# Patient Record
Sex: Male | Born: 1985 | Race: Black or African American | Hispanic: No | Marital: Single | Smoking: Current every day smoker
Health system: Southern US, Community
[De-identification: ages and names within clinical notes are randomized; demographics above are authoritative.]

## PROBLEM LIST (undated history)

## (undated) DIAGNOSIS — F1910 Other psychoactive substance abuse, uncomplicated: Secondary | ICD-10-CM

## (undated) HISTORY — PX: ABDOMINAL SURGERY: SHX537

---

## 2015-01-22 ENCOUNTER — Encounter (HOSPITAL_COMMUNITY): Payer: Self-pay | Admitting: Emergency Medicine

## 2015-01-22 ENCOUNTER — Inpatient Hospital Stay (HOSPITAL_COMMUNITY): Payer: Self-pay | Admitting: Anesthesiology

## 2015-01-22 ENCOUNTER — Emergency Department (HOSPITAL_COMMUNITY): Payer: Self-pay

## 2015-01-22 ENCOUNTER — Encounter (HOSPITAL_COMMUNITY): Admission: EM | Disposition: A | Discharge status: Home / Self Care | Payer: Self-pay | Source: Home / Self Care

## 2015-01-22 ENCOUNTER — Inpatient Hospital Stay (HOSPITAL_COMMUNITY): Payer: MEDICAID | Admitting: Anesthesiology

## 2015-01-22 ENCOUNTER — Inpatient Hospital Stay (HOSPITAL_COMMUNITY)
Admission: EM | Admit: 2015-01-22 | Discharge: 2015-01-28 | DRG: 330 | Disposition: A | Discharge status: Home / Self Care | Payer: Self-pay | Attending: General Surgery | Admitting: General Surgery

## 2015-01-22 DIAGNOSIS — F1010 Alcohol abuse, uncomplicated: Secondary | ICD-10-CM | POA: Diagnosis present

## 2015-01-22 DIAGNOSIS — F1210 Cannabis abuse, uncomplicated: Secondary | ICD-10-CM | POA: Diagnosis present

## 2015-01-22 DIAGNOSIS — K266 Chronic or unspecified duodenal ulcer with both hemorrhage and perforation: Principal | ICD-10-CM | POA: Diagnosis present

## 2015-01-22 DIAGNOSIS — F17210 Nicotine dependence, cigarettes, uncomplicated: Secondary | ICD-10-CM | POA: Diagnosis present

## 2015-01-22 DIAGNOSIS — K265 Chronic or unspecified duodenal ulcer with perforation: Secondary | ICD-10-CM | POA: Diagnosis present

## 2015-01-22 DIAGNOSIS — I10 Essential (primary) hypertension: Secondary | ICD-10-CM | POA: Diagnosis present

## 2015-01-22 DIAGNOSIS — R198 Other specified symptoms and signs involving the digestive system and abdomen: Secondary | ICD-10-CM | POA: Diagnosis present

## 2015-01-22 DIAGNOSIS — K567 Ileus, unspecified: Secondary | ICD-10-CM | POA: Diagnosis not present

## 2015-01-22 DIAGNOSIS — F1410 Cocaine abuse, uncomplicated: Secondary | ICD-10-CM | POA: Diagnosis present

## 2015-01-22 DIAGNOSIS — K660 Peritoneal adhesions (postprocedural) (postinfection): Secondary | ICD-10-CM | POA: Diagnosis present

## 2015-01-22 DIAGNOSIS — R109 Unspecified abdominal pain: Secondary | ICD-10-CM

## 2015-01-22 DIAGNOSIS — R52 Pain, unspecified: Secondary | ICD-10-CM

## 2015-01-22 HISTORY — DX: Other psychoactive substance abuse, uncomplicated: F19.10

## 2015-01-22 HISTORY — PX: LAPAROTOMY: SHX154

## 2015-01-22 LAB — URINALYSIS, ROUTINE W REFLEX MICROSCOPIC
BILIRUBIN URINE: NEGATIVE
GLUCOSE, UA: NEGATIVE mg/dL
Ketones, ur: NEGATIVE mg/dL
Leukocytes, UA: NEGATIVE
Nitrite: NEGATIVE
Protein, ur: NEGATIVE mg/dL
SPECIFIC GRAVITY, URINE: 1.014 (ref 1.005–1.030)
UROBILINOGEN UA: 1 mg/dL (ref 0.0–1.0)
pH: 6.5 (ref 5.0–8.0)

## 2015-01-22 LAB — CBC WITH DIFFERENTIAL/PLATELET
BASOS PCT: 0 % (ref 0–1)
Basophils Absolute: 0 10*3/uL (ref 0.0–0.1)
EOS ABS: 0.2 10*3/uL (ref 0.0–0.7)
EOS PCT: 2 % (ref 0–5)
HEMATOCRIT: 52.4 % — AB (ref 39.0–52.0)
HEMOGLOBIN: 17.6 g/dL — AB (ref 13.0–17.0)
Lymphocytes Relative: 24 % (ref 12–46)
Lymphs Abs: 3.1 10*3/uL (ref 0.7–4.0)
MCH: 30.2 pg (ref 26.0–34.0)
MCHC: 33.6 g/dL (ref 30.0–36.0)
MCV: 90 fL (ref 78.0–100.0)
MONO ABS: 0.7 10*3/uL (ref 0.1–1.0)
MONOS PCT: 6 % (ref 3–12)
NEUTROS ABS: 8.6 10*3/uL — AB (ref 1.7–7.7)
Neutrophils Relative %: 68 % (ref 43–77)
Platelets: 280 10*3/uL (ref 150–400)
RBC: 5.82 MIL/uL — ABNORMAL HIGH (ref 4.22–5.81)
RDW: 13.8 % (ref 11.5–15.5)
WBC: 12.6 10*3/uL — ABNORMAL HIGH (ref 4.0–10.5)

## 2015-01-22 LAB — COMPREHENSIVE METABOLIC PANEL
ALBUMIN: 4.2 g/dL (ref 3.5–5.2)
ALT: 25 U/L (ref 0–53)
ANION GAP: 8 (ref 5–15)
AST: 33 U/L (ref 0–37)
Alkaline Phosphatase: 56 U/L (ref 39–117)
BUN: 7 mg/dL (ref 6–23)
CO2: 26 mmol/L (ref 19–32)
CREATININE: 0.98 mg/dL (ref 0.50–1.35)
Calcium: 9.1 mg/dL (ref 8.4–10.5)
Chloride: 106 mmol/L (ref 96–112)
GFR calc Af Amer: >90 mL/min (ref >90)
GFR calc non Af Amer: >90 mL/min (ref >90)
Glucose, Bld: 119 mg/dL — ABNORMAL HIGH (ref 70–99)
Potassium: 3.8 mmol/L (ref 3.5–5.1)
Sodium: 140 mmol/L (ref 135–145)
TOTAL PROTEIN: 7.5 g/dL (ref 6.0–8.3)
Total Bilirubin: 1.4 mg/dL — ABNORMAL HIGH (ref 0.3–1.2)

## 2015-01-22 LAB — CREATININE, SERUM
CREATININE: 1.2 mg/dL (ref 0.50–1.35)
GFR calc Af Amer: >90 mL/min (ref >90)
GFR calc non Af Amer: 80 mL/min — ABNORMAL LOW (ref >90)

## 2015-01-22 LAB — CBC
HCT: 52 % (ref 39.0–52.0)
Hemoglobin: 17.2 g/dL — ABNORMAL HIGH (ref 13.0–17.0)
MCH: 30.4 pg (ref 26.0–34.0)
MCHC: 33.1 g/dL (ref 30.0–36.0)
MCV: 91.9 fL (ref 78.0–100.0)
PLATELETS: 256 10*3/uL (ref 150–400)
RBC: 5.66 MIL/uL (ref 4.22–5.81)
RDW: 13.8 % (ref 11.5–15.5)
WBC: 21.2 10*3/uL — ABNORMAL HIGH (ref 4.0–10.5)

## 2015-01-22 LAB — ETHANOL

## 2015-01-22 LAB — LIPASE, BLOOD: Lipase: 18 U/L (ref 11–59)

## 2015-01-22 LAB — APTT: aPTT: 36 seconds (ref 24–37)

## 2015-01-22 LAB — PROTIME-INR
INR: 1.12 (ref 0.00–1.49)
PROTHROMBIN TIME: 14.5 s (ref 11.6–15.2)

## 2015-01-22 LAB — TYPE AND SCREEN
ABO/RH(D): O POS
ANTIBODY SCREEN: NEGATIVE

## 2015-01-22 LAB — URINE MICROSCOPIC-ADD ON

## 2015-01-22 LAB — ABO/RH: ABO/RH(D): O POS

## 2015-01-22 SURGERY — LAPAROTOMY, EXPLORATORY
Anesthesia: General | Site: Abdomen

## 2015-01-22 MED ORDER — LACTATED RINGERS IV SOLN
INTRAVENOUS | Status: DC
  Administered 2015-01-22: 1000 mL via INTRAVENOUS
  Administered 2015-01-22 (×2): via INTRAVENOUS

## 2015-01-22 MED ORDER — MIDAZOLAM HCL 5 MG/5ML IJ SOLN
INTRAMUSCULAR | Status: DC | PRN
  Administered 2015-01-22: 2 mg via INTRAVENOUS

## 2015-01-22 MED ORDER — HYDROMORPHONE HCL 1 MG/ML IJ SOLN: 0.2500 mg | INTRAMUSCULAR | Status: DC | PRN

## 2015-01-22 MED ORDER — KETAMINE HCL 10 MG/ML IJ SOLN
INTRAMUSCULAR | Status: DC | PRN
  Administered 2015-01-22: 20 mg via INTRAVENOUS
  Administered 2015-01-22: 30 mg via INTRAVENOUS

## 2015-01-22 MED ORDER — ONDANSETRON HCL 4 MG PO TABS: 4.0000 mg | ORAL_TABLET | Freq: Four times a day (QID) | ORAL | Status: DC | PRN

## 2015-01-22 MED ORDER — LIDOCAINE HCL (CARDIAC) 20 MG/ML IV SOLN
INTRAVENOUS | Status: AC
  Filled 2015-01-22: qty 5

## 2015-01-22 MED ORDER — ONDANSETRON HCL 4 MG/2ML IJ SOLN: 4.0000 mg | Freq: Four times a day (QID) | INTRAMUSCULAR | Status: DC | PRN

## 2015-01-22 MED ORDER — DEXAMETHASONE SODIUM PHOSPHATE 10 MG/ML IJ SOLN
INTRAMUSCULAR | Status: AC
  Filled 2015-01-22: qty 1

## 2015-01-22 MED ORDER — ONDANSETRON HCL 4 MG/2ML IJ SOLN: 4.0000 mg | Freq: Once | INTRAMUSCULAR | Status: DC | PRN

## 2015-01-22 MED ORDER — ONDANSETRON HCL 4 MG/2ML IJ SOLN
INTRAMUSCULAR | Status: DC | PRN
  Administered 2015-01-22: 4 mg via INTRAVENOUS

## 2015-01-22 MED ORDER — HYDROMORPHONE HCL 1 MG/ML IJ SOLN
0.2500 mg | INTRAMUSCULAR | Status: DC | PRN
  Administered 2015-01-22: 0.25 mg via INTRAVENOUS
  Administered 2015-01-22 (×2): 0.5 mg via INTRAVENOUS
  Administered 2015-01-22: 0.25 mg via INTRAVENOUS

## 2015-01-22 MED ORDER — HYDROCODONE-ACETAMINOPHEN 5-325 MG PO TABS: 1.0000 | ORAL_TABLET | ORAL | Status: DC | PRN

## 2015-01-22 MED ORDER — POTASSIUM CHLORIDE IN NACL 20-0.9 MEQ/L-% IV SOLN
INTRAVENOUS | Status: DC
  Filled 2015-01-22 (×2): qty 1000

## 2015-01-22 MED ORDER — SODIUM CHLORIDE 0.9 % IR SOLN
Status: DC | PRN
  Administered 2015-01-22: 4000 mL

## 2015-01-22 MED ORDER — PROPOFOL 10 MG/ML IV BOLUS
INTRAVENOUS | Status: AC
  Filled 2015-01-22: qty 20

## 2015-01-22 MED ORDER — HYDROMORPHONE HCL 2 MG/ML IJ SOLN
INTRAMUSCULAR | Status: AC
  Filled 2015-01-22: qty 1

## 2015-01-22 MED ORDER — HYDROMORPHONE HCL 1 MG/ML IJ SOLN
INTRAMUSCULAR | Status: DC | PRN
  Administered 2015-01-22: 0.5 mg via INTRAVENOUS

## 2015-01-22 MED ORDER — PANTOPRAZOLE SODIUM 40 MG IV SOLR
40.0000 mg | Freq: Two times a day (BID) | INTRAVENOUS | Status: DC
  Administered 2015-01-22 – 2015-01-27 (×11): 40 mg via INTRAVENOUS
  Filled 2015-01-22 (×13): qty 40

## 2015-01-22 MED ORDER — ONDANSETRON HCL 4 MG/2ML IJ SOLN
4.0000 mg | Freq: Once | INTRAMUSCULAR | Status: AC
Start: 2015-01-22 — End: 2015-01-22
  Administered 2015-01-22: 4 mg via INTRAVENOUS
  Filled 2015-01-22: qty 2

## 2015-01-22 MED ORDER — SODIUM CHLORIDE 0.9 % IJ SOLN
INTRAMUSCULAR | Status: AC
  Filled 2015-01-22: qty 10

## 2015-01-22 MED ORDER — PIPERACILLIN-TAZOBACTAM 3.375 G IVPB
3.3750 g | Freq: Three times a day (TID) | INTRAVENOUS | Status: DC
  Administered 2015-01-22 – 2015-01-28 (×17): 3.375 g via INTRAVENOUS
  Filled 2015-01-22 (×20): qty 50

## 2015-01-22 MED ORDER — PANTOPRAZOLE SODIUM 40 MG IV SOLR: 40.0000 mg | Freq: Two times a day (BID) | INTRAVENOUS | Status: DC

## 2015-01-22 MED ORDER — CISATRACURIUM BESYLATE 20 MG/10ML IV SOLN
INTRAVENOUS | Status: AC
  Filled 2015-01-22: qty 10

## 2015-01-22 MED ORDER — MIDAZOLAM HCL 2 MG/2ML IJ SOLN
INTRAMUSCULAR | Status: AC
  Filled 2015-01-22: qty 2

## 2015-01-22 MED ORDER — PIPERACILLIN-TAZOBACTAM 3.375 G IVPB: 3.3750 g | Freq: Three times a day (TID) | INTRAVENOUS | Status: DC

## 2015-01-22 MED ORDER — ENOXAPARIN SODIUM 40 MG/0.4ML ~~LOC~~ SOLN
40.0000 mg | SUBCUTANEOUS | Status: DC
  Administered 2015-01-23 – 2015-01-28 (×6): 40 mg via SUBCUTANEOUS
  Filled 2015-01-22 (×6): qty 0.4

## 2015-01-22 MED ORDER — HYDROMORPHONE HCL 1 MG/ML IJ SOLN
INTRAMUSCULAR | Status: AC
  Filled 2015-01-22: qty 1

## 2015-01-22 MED ORDER — PIPERACILLIN-TAZOBACTAM 3.375 G IVPB 30 MIN
3.3750 g | INTRAVENOUS | Status: AC
  Administered 2015-01-22: 3.375 g via INTRAVENOUS
  Filled 2015-01-22: qty 50

## 2015-01-22 MED ORDER — SODIUM CHLORIDE 0.9 % IV BOLUS (SEPSIS)
1000.0000 mL | Freq: Once | INTRAVENOUS | Status: AC
  Administered 2015-01-22: 1000 mL via INTRAVENOUS

## 2015-01-22 MED ORDER — SUFENTANIL CITRATE 50 MCG/ML IV SOLN
INTRAVENOUS | Status: DC | PRN
  Administered 2015-01-22: 5 ug via INTRAVENOUS
  Administered 2015-01-22: 10 ug via INTRAVENOUS
  Administered 2015-01-22: 5 ug via INTRAVENOUS
  Administered 2015-01-22: 10 ug via INTRAVENOUS
  Administered 2015-01-22: 20 ug via INTRAVENOUS

## 2015-01-22 MED ORDER — MORPHINE SULFATE 2 MG/ML IJ SOLN: 1.0000 mg | INTRAMUSCULAR | Status: DC | PRN

## 2015-01-22 MED ORDER — HYDROMORPHONE HCL 1 MG/ML IJ SOLN
0.5000 mg | Freq: Once | INTRAMUSCULAR | Status: AC
  Administered 2015-01-22: 0.5 mg via INTRAVENOUS
  Filled 2015-01-22: qty 1

## 2015-01-22 MED ORDER — PANTOPRAZOLE SODIUM 40 MG IV SOLR
40.0000 mg | Freq: Once | INTRAVENOUS | Status: AC
Start: 2015-01-22 — End: 2015-01-22
  Administered 2015-01-22: 40 mg via INTRAVENOUS
  Filled 2015-01-22: qty 40

## 2015-01-22 MED ORDER — HYDROMORPHONE HCL 1 MG/ML IJ SOLN
1.0000 mg | INTRAMUSCULAR | Status: DC | PRN
  Administered 2015-01-22 – 2015-01-23 (×8): 1 mg via INTRAVENOUS
  Filled 2015-01-22 (×8): qty 1

## 2015-01-22 MED ORDER — DEXAMETHASONE SODIUM PHOSPHATE 10 MG/ML IJ SOLN
INTRAMUSCULAR | Status: DC | PRN
  Administered 2015-01-22: 10 mg via INTRAVENOUS

## 2015-01-22 MED ORDER — LIDOCAINE HCL (CARDIAC) 20 MG/ML IV SOLN
INTRAVENOUS | Status: DC | PRN
  Administered 2015-01-22: 100 mg via INTRAVENOUS

## 2015-01-22 MED ORDER — ONDANSETRON HCL 4 MG/2ML IJ SOLN
INTRAMUSCULAR | Status: AC
  Filled 2015-01-22: qty 2

## 2015-01-22 MED ORDER — HYDROMORPHONE HCL 1 MG/ML IJ SOLN
0.5000 mg | Freq: Once | INTRAMUSCULAR | Status: AC
Start: 2015-01-22 — End: 2015-01-22
  Administered 2015-01-22: 0.5 mg via INTRAVENOUS
  Filled 2015-01-22: qty 1

## 2015-01-22 MED ORDER — NEOSTIGMINE METHYLSULFATE 10 MG/10ML IV SOLN
INTRAVENOUS | Status: AC
  Filled 2015-01-22: qty 1

## 2015-01-22 MED ORDER — NEOSTIGMINE METHYLSULFATE 10 MG/10ML IV SOLN
INTRAVENOUS | Status: DC | PRN
  Administered 2015-01-22: 4 mg via INTRAVENOUS

## 2015-01-22 MED ORDER — PROPOFOL 10 MG/ML IV BOLUS
INTRAVENOUS | Status: DC | PRN
  Administered 2015-01-22: 200 mg via INTRAVENOUS

## 2015-01-22 MED ORDER — SUFENTANIL CITRATE 50 MCG/ML IV SOLN
INTRAVENOUS | Status: AC
  Filled 2015-01-22: qty 1

## 2015-01-22 MED ORDER — HYDROMORPHONE HCL 1 MG/ML IJ SOLN: 1.0000 mg | INTRAMUSCULAR | Status: DC | PRN

## 2015-01-22 MED ORDER — CISATRACURIUM BESYLATE (PF) 10 MG/5ML IV SOLN
INTRAVENOUS | Status: DC | PRN
  Administered 2015-01-22: 10 mg via INTRAVENOUS

## 2015-01-22 MED ORDER — KCL IN DEXTROSE-NACL 20-5-0.45 MEQ/L-%-% IV SOLN
INTRAVENOUS | Status: DC
  Administered 2015-01-22 – 2015-01-24 (×5): via INTRAVENOUS
  Administered 2015-01-25: 125 mL/h via INTRAVENOUS
  Administered 2015-01-25: 1000 mL via INTRAVENOUS
  Administered 2015-01-26 (×3): via INTRAVENOUS
  Administered 2015-01-27: 50 mL/h via INTRAVENOUS
  Filled 2015-01-22 (×16): qty 1000

## 2015-01-22 MED ORDER — GLYCOPYRROLATE 0.2 MG/ML IJ SOLN
INTRAMUSCULAR | Status: DC | PRN
  Administered 2015-01-22: 0.6 mg via INTRAVENOUS

## 2015-01-22 MED ORDER — SUCCINYLCHOLINE CHLORIDE 20 MG/ML IJ SOLN
INTRAMUSCULAR | Status: DC | PRN
Start: 2015-01-22 — End: 2015-01-22
  Administered 2015-01-22: 100 mg via INTRAVENOUS

## 2015-01-22 SURGICAL SUPPLY — 42 items
APPLICATOR COTTON TIP 6IN STRL (MISCELLANEOUS) IMPLANT
BLADE EXTENDED COATED 6.5IN (ELECTRODE) ×2 IMPLANT
BLADE HEX COATED 2.75 (ELECTRODE) ×2 IMPLANT
COVER MAYO STAND STRL (DRAPES) ×2 IMPLANT
DRAIN CHANNEL 19F RND (DRAIN) ×2 IMPLANT
DRAPE LAPAROSCOPIC ABDOMINAL (DRAPES) ×2 IMPLANT
DRAPE WARM FLUID 44X44 (DRAPE) ×2 IMPLANT
ELECT REM PT RETURN 9FT ADLT (ELECTROSURGICAL) ×2
ELECTRODE REM PT RTRN 9FT ADLT (ELECTROSURGICAL) ×1 IMPLANT
EVACUATOR DRAINAGE 10X20 100CC (DRAIN) ×1 IMPLANT
EVACUATOR SILICONE 100CC (DRAIN) ×1
GAUZE SPONGE 4X4 12PLY STRL (GAUZE/BANDAGES/DRESSINGS) ×2 IMPLANT
GLOVE BIOGEL PI IND STRL 7.0 (GLOVE) ×1 IMPLANT
GLOVE BIOGEL PI INDICATOR 7.0 (GLOVE) ×1
GLOVE SURG ORTHO 8.0 STRL STRW (GLOVE) ×2 IMPLANT
GOWN STRL REUS W/TWL LRG LVL3 (GOWN DISPOSABLE) ×2 IMPLANT
GOWN STRL REUS W/TWL XL LVL3 (GOWN DISPOSABLE) ×4 IMPLANT
HOLDER FOLEY CATH W/STRAP (MISCELLANEOUS) ×2 IMPLANT
KIT BASIN OR (CUSTOM PROCEDURE TRAY) ×2 IMPLANT
NS IRRIG 1000ML POUR BTL (IV SOLUTION) ×2 IMPLANT
PACK GENERAL/GYN (CUSTOM PROCEDURE TRAY) ×2 IMPLANT
PAD ABD 8X10 STRL (GAUZE/BANDAGES/DRESSINGS) ×2 IMPLANT
SPONGE LAP 18X18 X RAY DECT (DISPOSABLE) IMPLANT
STAPLER VISISTAT 35W (STAPLE) ×2 IMPLANT
SUCTION POOLE TIP (SUCTIONS) ×2 IMPLANT
SUT ETHILON 3 0 PS 1 (SUTURE) ×2 IMPLANT
SUT NOV 1 T60/GS (SUTURE) IMPLANT
SUT NOVA NAB DX-16 0-1 5-0 T12 (SUTURE) ×6 IMPLANT
SUT SILK 2 0 (SUTURE) ×1
SUT SILK 2 0 SH CR/8 (SUTURE) ×2 IMPLANT
SUT SILK 2-0 18XBRD TIE 12 (SUTURE) ×1 IMPLANT
SUT SILK 3 0 (SUTURE) ×1
SUT SILK 3 0 SH CR/8 (SUTURE) ×2 IMPLANT
SUT SILK 3-0 18XBRD TIE 12 (SUTURE) ×1 IMPLANT
SUT VICRYL 2 0 18  UND BR (SUTURE)
SUT VICRYL 2 0 18 UND BR (SUTURE) IMPLANT
TAPE CLOTH SURG 6X10 WHT LF (GAUZE/BANDAGES/DRESSINGS) ×2 IMPLANT
TOWEL OR 17X26 10 PK STRL BLUE (TOWEL DISPOSABLE) ×4 IMPLANT
TRAY FOLEY CATH 14FRSI W/METER (CATHETERS) IMPLANT
TRAY FOLEY CATH 16FR SILVER (SET/KITS/TRAYS/PACK) ×2 IMPLANT
WATER STERILE IRR 1500ML POUR (IV SOLUTION) IMPLANT
YANKAUER SUCT BULB TIP NO VENT (SUCTIONS) ×2 IMPLANT

## 2015-01-22 NOTE — H&P (Signed)
Victor Monroe is an 29 y.o. male.   Chief Complaint:  "excruiating abdominal pain" HPI: 29 y/o male who reports nausea and vomiting on and off for 2 months.  He has blood in his emesis, so he has quit drinking.  Unfortunately he continues to have abdominal pain, with nausea and vomiting.  He had it last PM when he went to bed, with hematemesis.  This morning he woke up and tried to get up to go to bathroom.   He reports,  "excruciating pain," and was transferred to the hospital. He has not had anything this bad before this AM.  Pain is mid epigastric.   Work up in the ED shows he is afebrile, but hypertensive.  Labs show elevated bilirubin 1.4.  WBC 12.6.  H/H 17.6/52.  Film in the ED shows:There is free air under right hemidiaphragm consistent with perforated viscus. Mild gaseous distended small bowel loops mid abdomen. Ileus or bowel obstruction cannot be excluded.  We were called for the Free air.  Past Medical History  Diagnosis Date  . Polysubstance abuse including ETOH, tobacco, cocaine and marijuana      History reviewed. No pertinent past surgical history: no surgeries  History reviewed. No pertinent family history. Social History:  reports that he has been smoking.  He does not have any smokeless tobacco history on file. He reports that he drinks alcohol. He reports that he uses illicit drugs (Cocaine). Tobacco:  ,1PPD for 14 years ETOH:  2-3 bottles of wine per day for 2-3 years, quit about 2 months ago with onset of hematemesis  Drugs:  Cocaine biweekly, Marijuana weekly Single lives with family Works maintance at [employer]   Allergies: No Known Allergies  Prior to Admission medications   Not on File      Results for orders placed or performed during the hospital encounter of 01/22/15 (from the past 48 hour(s))  CBC with Differential/Platelet     Status: Abnormal   Collection Time: 01/22/15  8:32 AM  Result Value Ref Range   WBC 12.6 (H) 4.0 - 10.5 K/uL   RBC 5.82  (H) 4.22 - 5.81 MIL/uL   Hemoglobin 17.6 (H) 13.0 - 17.0 g/dL   HCT 52.4 (H) 39.0 - 52.0 %   MCV 90.0 78.0 - 100.0 fL   MCH 30.2 26.0 - 34.0 pg   MCHC 33.6 30.0 - 36.0 g/dL   RDW 13.8 11.5 - 15.5 %   Platelets 280 150 - 400 K/uL   Neutrophils Relative % 68 43 - 77 %   Neutro Abs 8.6 (H) 1.7 - 7.7 K/uL   Lymphocytes Relative 24 12 - 46 %   Lymphs Abs 3.1 0.7 - 4.0 K/uL   Monocytes Relative 6 3 - 12 %   Monocytes Absolute 0.7 0.1 - 1.0 K/uL   Eosinophils Relative 2 0 - 5 %   Eosinophils Absolute 0.2 0.0 - 0.7 K/uL   Basophils Relative 0 0 - 1 %   Basophils Absolute 0.0 0.0 - 0.1 K/uL  Lipase, blood     Status: None   Collection Time: 01/22/15  8:32 AM  Result Value Ref Range   Lipase 18 11 - 59 U/L  Comprehensive metabolic panel     Status: Abnormal   Collection Time: 01/22/15  8:32 AM  Result Value Ref Range   Sodium 140 135 - 145 mmol/L   Potassium 3.8 3.5 - 5.1 mmol/L   Chloride 106 96 - 112 mmol/L   CO2 26 19 -  32 mmol/L   Glucose, Bld 119 (H) 70 - 99 mg/dL   BUN 7 6 - 23 mg/dL   Creatinine, Ser 0.98 0.50 - 1.35 mg/dL   Calcium 9.1 8.4 - 10.5 mg/dL   Total Protein 7.5 6.0 - 8.3 g/dL   Albumin 4.2 3.5 - 5.2 g/dL   AST 33 0 - 37 U/L   ALT 25 0 - 53 U/L   Alkaline Phosphatase 56 39 - 117 U/L   Total Bilirubin 1.4 (H) 0.3 - 1.2 mg/dL   GFR calc non Af Amer >90 >90 mL/min   GFR calc Af Amer >90 >90 mL/min    Comment: (NOTE) The eGFR has been calculated using the CKD EPI equation. This calculation has not been validated in all clinical situations. eGFR's persistently <90 mL/min signify possible Chronic Kidney Disease.    Anion gap 8 5 - 15   Dg Abd Acute W/chest  01/22/2015   CLINICAL DATA:  Abdominal pain, polysubstance abuse  EXAM: DG ABDOMEN ACUTE W/ 1V CHEST  COMPARISON:  None.  FINDINGS: Cardiomediastinal silhouette is unremarkable. No acute infiltrate or pleural effusion. No pulmonary edema. There is free air under right hemidiaphragm consistent with perforated  viscus. Mild gaseous distended small bowel loops mid abdomen. Ileus or bowel obstruction cannot be excluded.  IMPRESSION: No acute infiltrate or pleural effusion. No pulmonary edema. There is free air under right hemidiaphragm consistent with perforated viscus. Mild gaseous distended small bowel loops mid abdomen. Ileus or bowel obstruction cannot be excluded.  These results were called by telephone at the time of interpretation on 01/22/2015 at 9:16 am to Dr. Milton Ferguson , who verbally acknowledged these results.   Electronically Signed   By: Lahoma Crocker M.D.   On: 01/22/2015 09:17    Review of Systems  Constitutional: Positive for weight loss (he is not sure how much). Negative for fever, chills, malaise/fatigue and diaphoresis.  HENT: Negative.   Eyes: Negative.   Respiratory: Negative.   Cardiovascular: Negative.   Gastrointestinal: Positive for nausea, vomiting and abdominal pain. Negative for diarrhea, constipation, blood in stool and melena.  Genitourinary: Negative.   Musculoskeletal: Negative.   Skin: Negative.   Neurological: Negative.  Negative for weakness.  Endo/Heme/Allergies: Negative.   Psychiatric/Behavioral: Negative.     Blood pressure 161/107, pulse 65, temperature 98.1 F (36.7 C), temperature source Oral, resp. rate 20, SpO2 100 %. Physical Exam  Constitutional: He is oriented to person, place, and time.  29 y/o male no acute distress, uncomfortable.  overweight  HENT:  Head: Normocephalic and atraumatic.  Nose: Nose normal.  Eyes: Conjunctivae and EOM are normal. Right eye exhibits no discharge. Left eye exhibits no discharge. No scleral icterus.  Neck: Normal range of motion. Neck supple. No JVD present. No tracheal deviation present. No thyromegaly present.  Cardiovascular: Normal rate, regular rhythm, normal heart sounds and intact distal pulses.   No murmur heard. Respiratory: Effort normal and breath sounds normal. No respiratory distress. He has no wheezes.  He has no rales. He exhibits no tenderness.  GI: Soft. He exhibits no distension and no mass. There is tenderness. There is no rebound and no guarding.  Musculoskeletal: He exhibits no edema.  Lymphadenopathy:    He has no cervical adenopathy.  Neurological: He is alert and oriented to person, place, and time. A cranial nerve deficit is present.  Skin: Skin is warm and dry. No rash noted. No erythema. No pallor.  Psychiatric: He has a normal mood and affect. His  behavior is normal. Judgment and thought content normal.     Assessment/Plan Perforated viscus  Polysubstance use.  Plan:  For the OR, exploratory laparotomy with possible bowel resection.  JENNINGS,WILLARD 01/22/2015, 9:34 AM

## 2015-01-22 NOTE — ED Notes (Signed)
Per EMS: Pt c/o gen abd pain x 2 months.  States he cut back on drinking when the pain started and he noticed that he would have blood tinged sputum.  States he "put it off".  Admits to cocaine and etoh use.  Last etoh was 3 days ago.  States he was not able to urinate this morning so he called 911.

## 2015-01-22 NOTE — ED Notes (Signed)
Pt returned from X-ray.  

## 2015-01-22 NOTE — Anesthesia Procedure Notes (Signed)
Procedure Name: Intubation Date/Time: 01/22/2015 12:39 PM Performed by: Leroy LibmanEARDON, Victor Fana L Patient Re-evaluated:Patient Re-evaluated prior to inductionOxygen Delivery Method: Circle system utilized Preoxygenation: Pre-oxygenation with 100% oxygen Intubation Type: IV induction, Rapid sequence and Cricoid Pressure applied Laryngoscope Size: Miller and 3 Grade View: Grade I Tube type: Oral Tube size: 8.0 mm Number of attempts: 1 Airway Equipment and Method: Stylet Placement Confirmation: ETT inserted through vocal cords under direct vision,  breath sounds checked- equal and bilateral and positive ETCO2 Secured at: 22 cm Tube secured with: Tape Dental Injury: Teeth and Oropharynx as per pre-operative assessment

## 2015-01-22 NOTE — Brief Op Note (Signed)
01/22/2015  1:39 PM  PATIENT:  Victor Monroe  29 y.o. male  PRE-OPERATIVE DIAGNOSIS:  perforated viscus  POST-OPERATIVE DIAGNOSIS:  perforated duodenal ulcer  PROCEDURE:  Procedure(s): EXPLORATORY LAPAROTOMY,  GRAHAM PATCH CLOSURE OF PERFORATED DUODENAL ULCER (N/A)  SURGEON:  Surgeon(s) and Role:    * Darnell Levelodd Sayana Salley, MD - Primary  ANESTHESIA:   general  EBL:  Total I/O In: 2000 [I.V.:2000] Out: 150 [Urine:100; Blood:50]  BLOOD ADMINISTERED:none  DRAINS: (19Fr) Blake drain(s) in the subhepatic space   LOCAL MEDICATIONS USED:  NONE  SPECIMEN:  No Specimen  DISPOSITION OF SPECIMEN:  N/A  COUNTS:  YES  TOURNIQUET:  * No tourniquets in log *  DICTATION: .Other Dictation: Dictation Number [id number]) [id number]  PLAN OF CARE: Admit to inpatient   PATIENT DISPOSITION:  PACU - hemodynamically stable.   Delay start of Pharmacological VTE agent (>24hrs) due to surgical blood loss or risk of bleeding: yes  Velora Hecklerodd M. Leonell Lobdell, MD, [facility] Office: [phone]

## 2015-01-22 NOTE — ED Notes (Signed)
Bed: WA04 Expected date:  Expected time:  Means of arrival:  Comments: abd pain 

## 2015-01-22 NOTE — ED Notes (Signed)
Pt currently in X-ray.

## 2015-01-22 NOTE — ED Provider Notes (Signed)
CSN: [id number]     Arrival date & time 01/22/15  0801 History   First MD Initiated Contact with Patient 01/22/15 [phone]     Chief Complaint  Patient presents with  . Abdominal Pain     (Consider location/radiation/quality/duration/timing/severity/associated sxs/prior Treatment) Patient is a 29 y.o. male presenting with abdominal pain. The history is provided by the patient (the pt complains of abd pain for 2 months.  much worse today).  Abdominal Pain Pain location:  Epigastric Pain quality: not aching   Pain radiates to:  Does not radiate Pain severity:  Moderate Onset quality:  Sudden Timing:  Constant Progression:  Unchanged Chronicity:  New Context: not alcohol use   Relieved by:  Nothing Associated symptoms: no chest pain, no cough, no diarrhea, no fatigue and no hematuria     Past Medical History  Diagnosis Date  . Polysubstance abuse    History reviewed. No pertinent past surgical history. History reviewed. No pertinent family history. History  Substance Use Topics  . Smoking status: Current Every Day Smoker  . Smokeless tobacco: Not on file  . Alcohol Use: Yes    Review of Systems  Constitutional: Negative for appetite change and fatigue.  HENT: Negative for congestion, ear discharge and sinus pressure.   Eyes: Negative for discharge.  Respiratory: Negative for cough.   Cardiovascular: Negative for chest pain.  Gastrointestinal: Positive for abdominal pain. Negative for diarrhea.  Genitourinary: Negative for frequency and hematuria.  Musculoskeletal: Negative for back pain.  Skin: Negative for rash.  Neurological: Negative for seizures and headaches.  Psychiatric/Behavioral: Negative for hallucinations.      Allergies  Review of patient's allergies indicates no known allergies.  Home Medications   Prior to Admission medications   Not on File   BP 161/107 mmHg  Pulse 65  Temp(Src) 98.1 F (36.7 C) (Oral)  Resp 20  SpO2 100% Physical Exam   Constitutional: He is oriented to person, place, and time. He appears well-developed.  HENT:  Head: Normocephalic.  Eyes: Conjunctivae and EOM are normal. No scleral icterus.  Neck: Neck supple. No thyromegaly present.  Cardiovascular: Normal rate and regular rhythm.  Exam reveals no gallop and no friction rub.   No murmur heard. Pulmonary/Chest: No stridor. He has no wheezes. He has no rales. He exhibits no tenderness.  Abdominal: He exhibits no distension. There is tenderness. There is no rebound.  Musculoskeletal: Normal range of motion. He exhibits no edema.  Lymphadenopathy:    He has no cervical adenopathy.  Neurological: He is oriented to person, place, and time. He exhibits normal muscle tone. Coordination normal.  Skin: No rash noted. No erythema.  Psychiatric: He has a normal mood and affect. His behavior is normal.    ED Course  Procedures (including critical care time) Labs Review Labs Reviewed  CBC WITH DIFFERENTIAL/PLATELET - Abnormal; Notable for the following:    WBC 12.6 (*)    RBC 5.82 (*)    Hemoglobin 17.6 (*)    HCT 52.4 (*)    Neutro Abs 8.6 (*)    All other components within normal limits  COMPREHENSIVE METABOLIC PANEL - Abnormal; Notable for the following:    Glucose, Bld 119 (*)    Total Bilirubin 1.4 (*)    All other components within normal limits  LIPASE, BLOOD  ETHANOL  APTT  PROTIME-INR  DRUGS OF ABUSE SCREEN W/O ALC, ROUTINE URINE  URINALYSIS, ROUTINE W REFLEX MICROSCOPIC  TYPE AND SCREEN    Imaging Review Dg Abd  Acute W/chest  01/22/2015   CLINICAL DATA:  Abdominal pain, polysubstance abuse  EXAM: DG ABDOMEN ACUTE W/ 1V CHEST  COMPARISON:  None.  FINDINGS: Cardiomediastinal silhouette is unremarkable. No acute infiltrate or pleural effusion. No pulmonary edema. There is free air under right hemidiaphragm consistent with perforated viscus. Mild gaseous distended small bowel loops mid abdomen. Ileus or bowel obstruction cannot be excluded.   IMPRESSION: No acute infiltrate or pleural effusion. No pulmonary edema. There is free air under right hemidiaphragm consistent with perforated viscus. Mild gaseous distended small bowel loops mid abdomen. Ileus or bowel obstruction cannot be excluded.  These results were called by telephone at the time of interpretation on 01/22/2015 at 9:16 am to Dr. Bethann BerkshireJOSEPH Quentyn Kolbeck , who verbally acknowledged these results.   Electronically Signed   By: Natasha MeadLiviu  Pop M.D.   On: 01/22/2015 09:17     EKG Interpretation   Date/Time:  Wednesday January 22 2015 08:08:42 EDT Ventricular Rate:  59 PR Interval:  147 QRS Duration: 101 QT Interval:  422 QTC Calculation: 418 R Axis:   77 Text Interpretation:  Sinus rhythm No old tracing to compare Confirmed by  CAMPOS  MD, Caryn BeeKEVIN ([id number]) on 01/22/2015 8:11:10 AM      MDM   Final diagnoses:  Pain  Abdominal pain in male    Admit to surgery for ruptured viscous     Bethann BerkshireJoseph Zan Orlick, MD 01/22/15 1031

## 2015-01-22 NOTE — Transfer of Care (Signed)
Immediate Anesthesia Transfer of Care Note  Patient: Victor Monroe  Procedure(s) Performed: Procedure(s): EXPLORATORY LAPAROTOMY,  GRAHAM PATCH CLOSURE OF PERFORATED DUODENAL ULCER (N/A)  Patient Location: PACU  Anesthesia Type:General  Level of Consciousness: awake  Airway & Oxygen Therapy: Patient Spontanous Breathing and Patient connected to face mask oxygen  Post-op Assessment: Report given to RN and Post -op Vital signs reviewed and stable  Post vital signs: Reviewed and stable  Last Vitals:  Filed Vitals:   01/22/15 0803  BP: 161/107  Pulse: 65  Temp: 36.7 C  Resp: 20    Complications: No apparent anesthesia complications

## 2015-01-22 NOTE — Progress Notes (Signed)
ANTIBIOTIC CONSULT NOTE - INITIAL  Pharmacy Consult for Zosyn Indication: Intra-abdominal infection  No Known Allergies  Patient Measurements:   Adjusted Body Weight:   Vital Signs: Temp: 98.1 F (36.7 C) (04/13 0803) Temp Source: Oral (04/13 0803) BP: 161/107 mmHg (04/13 0803) Pulse Rate: 65 (04/13 0803) Intake/Output from previous day:   Intake/Output from this shift:    Labs:  Recent Labs  01/22/15 0832  WBC 12.6*  HGB 17.6*  PLT 280  CREATININE 0.98   CrCl cannot be calculated (Unknown ideal weight.). No results for input(s): VANCOTROUGH, VANCOPEAK, VANCORANDOM, GENTTROUGH, GENTPEAK, GENTRANDOM, TOBRATROUGH, TOBRAPEAK, TOBRARND, AMIKACINPEAK, AMIKACINTROU, AMIKACIN in the last 72 hours.   Microbiology: No results found for this or any previous visit (from the past 720 hour(s)).  Medical History: Past Medical History  Diagnosis Date  . Polysubstance abuse    Assessment: [age] yoM with PMHx polysubstance abuse presents with "excrutiating abdominal pain" and hematemesis as well as with nausea and vomiting intermittently for 2 months.  DG of abd shows free air with perforated viscus.  Plan for expolatory laparotomy with possible bowel resection.  Pharmacy consulted to start Zosyn.   4/13 >> Zosyn  >>    No micro data yet.   Goal of Therapy:  Eradication of infection  Plan:  Zosyn 3.375g over 30 min NOW then start Zosyn 3.375g IV q8h (infuse over 4 hours) F/u renal function, clinical course  Haynes Hoehnolleen Levorn Oleski, PharmD, BCPS 01/22/2015, 10:53 AM  Pager: [phone]

## 2015-01-22 NOTE — Anesthesia Preprocedure Evaluation (Signed)
Anesthesia Evaluation  Patient identified by MRN, date of birth, ID band Patient awake    Reviewed: Allergy & Precautions, NPO status , Patient's Chart, lab work & pertinent test results  Airway Mallampati: II  TM Distance: >3 FB Neck ROM: Full    Dental no notable dental hx.    Pulmonary Current Smoker,  breath sounds clear to auscultation  Pulmonary exam normal       Cardiovascular negative cardio ROS  Rhythm:Regular Rate:Normal     Neuro/Psych negative neurological ROS  negative psych ROS   GI/Hepatic negative GI ROS, Neg liver ROS,   Endo/Other  negative endocrine ROS  Renal/GU negative Renal ROS  negative genitourinary   Musculoskeletal negative musculoskeletal ROS (+)   Abdominal   Peds negative pediatric ROS (+)  Hematology negative hematology ROS (+)   Anesthesia Other Findings   Reproductive/Obstetrics negative OB ROS                             Anesthesia Physical Anesthesia Plan  ASA: III  Anesthesia Plan: General   Post-op Pain Management:    Induction: Intravenous  Airway Management Planned: Oral ETT  Additional Equipment:   Intra-op Plan:   Post-operative Plan: Extubation in OR  Informed Consent: I have reviewed the patients History and Physical, chart, labs and discussed the procedure including the risks, benefits and alternatives for the proposed anesthesia with the patient or authorized representative who has indicated his/her understanding and acceptance.   Dental advisory given  Plan Discussed with: CRNA  Anesthesia Plan Comments:         Anesthesia Quick Evaluation

## 2015-01-22 NOTE — ED Notes (Signed)
Pt CHG Prepped and changed into new gown

## 2015-01-22 NOTE — Anesthesia Postprocedure Evaluation (Signed)
  Anesthesia Post-op Note  Patient: Victor Monroe  Procedure(s) Performed: Procedure(s): EXPLORATORY LAPAROTOMY,  GRAHAM PATCH CLOSURE OF PERFORATED DUODENAL ULCER (N/A)  Patient Location: PACU  Anesthesia Type:General  Level of Consciousness: awake, oriented, sedated and patient cooperative  Airway and Oxygen Therapy: Patient Spontanous Breathing  Post-op Pain: mild  Post-op Assessment: Post-op Vital signs reviewed, Patient's Cardiovascular Status Stable, Respiratory Function Stable, Patent Airway, No signs of Nausea or vomiting and Pain level controlled  Post-op Vital Signs: stable  Last Vitals:  Filed Vitals:   01/22/15 1507  BP: 142/78  Pulse: 55  Temp: 36.9 C  Resp:     Complications: No apparent anesthesia complications

## 2015-01-23 ENCOUNTER — Encounter (HOSPITAL_COMMUNITY): Payer: Self-pay | Admitting: Surgery

## 2015-01-23 LAB — DRUGS OF ABUSE SCREEN W/O ALC, ROUTINE URINE
Amphetamine Screen, Ur: NEGATIVE
BENZODIAZEPINES.: POSITIVE — AB
Barbiturate Quant, Ur: NEGATIVE
COCAINE METABOLITES: POSITIVE — AB
Creatinine,U: 144.5 mg/dL
Marijuana Metabolite: POSITIVE — AB
Methadone: NEGATIVE
OPIATE SCREEN, URINE: NEGATIVE
PROPOXYPHENE: NEGATIVE
Phencyclidine (PCP): NEGATIVE

## 2015-01-23 LAB — BASIC METABOLIC PANEL
Anion gap: 6 (ref 5–15)
BUN: 8 mg/dL (ref 6–23)
CHLORIDE: 107 mmol/L (ref 96–112)
CO2: 26 mmol/L (ref 19–32)
Calcium: 8.8 mg/dL (ref 8.4–10.5)
Creatinine, Ser: 1.05 mg/dL (ref 0.50–1.35)
GFR calc non Af Amer: >90 mL/min (ref >90)
Glucose, Bld: 123 mg/dL — ABNORMAL HIGH (ref 70–99)
POTASSIUM: 4.3 mmol/L (ref 3.5–5.1)
SODIUM: 139 mmol/L (ref 135–145)

## 2015-01-23 LAB — CBC
HCT: 46.2 % (ref 39.0–52.0)
Hemoglobin: 15 g/dL (ref 13.0–17.0)
MCH: 29.7 pg (ref 26.0–34.0)
MCHC: 32.5 g/dL (ref 30.0–36.0)
MCV: 91.5 fL (ref 78.0–100.0)
PLATELETS: 220 10*3/uL (ref 150–400)
RBC: 5.05 MIL/uL (ref 4.22–5.81)
RDW: 13.9 % (ref 11.5–15.5)
WBC: 20.7 10*3/uL — AB (ref 4.0–10.5)

## 2015-01-23 MED ORDER — PHENOL 1.4 % MT LIQD
1.0000 | OROMUCOSAL | Status: DC | PRN
  Administered 2015-01-23: 1 via OROMUCOSAL
  Filled 2015-01-23: qty 177

## 2015-01-23 NOTE — Op Note (Signed)
[name], Victor Monroe          ACCOUNT NO.:  [id number]  MEDICAL RECORD NO.:  [id number]  LOCATION:  1540                         FACILITY[facility]  [facility]  PHYSICIAN:  Velora Heckler, MD      DATE OF BIRTH:  [birth year]  DATE OF PROCEDURE:  01/22/2015                              OPERATIVE REPORT   PREOPERATIVE DIAGNOSIS:  Perforated viscus.  POSTOPERATIVE DIAGNOSIS:  Perforated duodenal ulcer.  PROCEDURE: 1. Exploratory laparotomy. 2. Cheree Ditto patch closure of perforated duodenal ulcer.  SURGEON:  Velora Heckler, MD, FACS  ANESTHESIA:  General.  ESTIMATED BLOOD LOSS:  Minimal.  PREPARATION:  ChloraPrep.  COMPLICATIONS:  None.  INDICATIONS:  The patient is a 29 year old male who presented to the emergency department with abdominal pain.  Laboratory studies showed a mildly elevated white blood cell count.  Abdominal x-ray showed free air in the upper abdomen.  The patient was prepared urgently and brought to the operating room for exploration.  BODY OF REPORT:  Procedure was done in OR #1 at the [facility].  The patient was brought to the operating room, placed in supine position on the operating room table.  Following administration of general anesthesia, the patient was positioned and then prepped and draped in the usual aseptic fashion.  After ascertaining that an adequate level of anesthesia had been achieved, an upper midline abdominal incision was made with a #10 blade.  Dissection was carried through subcutaneous tissues.  Fascia was incised in the midline and the peritoneal cavity was entered cautiously.  There was greenish thin fluid throughout the upper abdomen.  This was evacuated. Balfour retractors placed for exposure.  Exploration reveals a 6 mm punctate ulcer immediately post pyloric in the duodenal bulb located anteriorly.  Omentum was freed from adhesions.  A tongue of omentum was prepared.  An attempt was made to primarily close the ulcer  with a 2-0 silk suture, but the tissue was quite friable and the suture material pulls through the walls of the ulcer easily.  Therefore, a g patch closure was performed using interrupted 2-0 silk sutures and a tongue of omentum which was secured over the ulcer with the 2-0 silk sutures which were then tied securely.  Good overlap and closure of the ulcer was achieved.  Abdomen was irrigated with several L of warm saline which were evacuated.  Greenish fluid was present all the way to the pelvis, the left upper quadrant, and above the liver.  This was all evacuated and irrigated until clear.  A 19-French Blake drain was brought in from right upper quadrant abdominal wall stab wound and placed across the proximal duodenum.  Drain was secured to the skin with a 3-0 nylon suture.  Midline abdominal incision was closed with interrupted #1 Novafil simple sutures.  Subcutaneous tissues were irrigated.  Skin was closed with stainless steel staples.  Drain was placed to bulb suction.  Sterile dressings were applied to the wound.  The patient was awakened from anesthesia and brought to the recovery room.  The patient tolerated the procedure well.   Velora Heckler, MD, [facility] Office: [phone]    TMG/MEDQ  D:  01/22/2015  T:  01/23/2015  Job:  [id number]

## 2015-01-23 NOTE — Progress Notes (Signed)
1 Day Post-Op  Subjective: He is doing well this AM.  Wounds look fine drain is clear.    Objective: Vital signs in last 24 hours: Temp:  [97.7 F (36.5 C)-98.7 F (37.1 C)] 97.9 F (36.6 C) (04/14 0600) Pulse Rate:  [53-75] 57 (04/14 0600) Resp:  [15-21] 18 (04/14 0600) BP: (121-165)/(70-109) 130/74 mmHg (04/14 0600) SpO2:  [96 %-100 %] 100 % (04/14 0600) Weight:  [104.327 kg (230 lb)] 104.327 kg (230 lb) (04/13 1215) Last BM Date: 01/21/15 1070 from NG NPO Afebrile, VSS WBC 20.7  Intake/Output from previous day: 04/13 0701 - 04/14 0700 In: 3720 [I.V.:3700; NG/GT:20] Out: 2810 [Urine:1575; Emesis/NG output:1070; Drains:115; Blood:50] Intake/Output this shift:    General appearance: alert, cooperative and no distress Resp: clear to auscultation bilaterally GI: soft, no bowel sounds, incision looks fine, drain is clear.  Lab Results:   Recent Labs  01/22/15 1720 01/23/15 0430  WBC 21.2* 20.7*  HGB 17.2* 15.0  HCT 52.0 46.2  PLT 256 220    BMET  Recent Labs  01/22/15 0832 01/22/15 1720 01/23/15 0430  NA 140  --  139  K 3.8  --  4.3  CL 106  --  107  CO2 26  --  26  GLUCOSE 119*  --  123*  BUN 7  --  8  CREATININE 0.98 1.20 1.05  CALCIUM 9.1  --  8.8   PT/INR  Recent Labs  01/22/15 0948  LABPROT 14.5  INR 1.12     Recent Labs Lab 01/22/15 0832  AST 33  ALT 25  ALKPHOS 56  BILITOT 1.4*  PROT 7.5  ALBUMIN 4.2     Lipase     Component Value Date/Time   LIPASE 18 01/22/2015 16100832     Studies/Results: Dg Abd Acute W/chest  01/22/2015   CLINICAL DATA:  Abdominal pain, polysubstance abuse  EXAM: DG ABDOMEN ACUTE W/ 1V CHEST  COMPARISON:  None.  FINDINGS: Cardiomediastinal silhouette is unremarkable. No acute infiltrate or pleural effusion. No pulmonary edema. There is free air under right hemidiaphragm consistent with perforated viscus. Mild gaseous distended small bowel loops mid abdomen. Ileus or bowel obstruction cannot be excluded.   IMPRESSION: No acute infiltrate or pleural effusion. No pulmonary edema. There is free air under right hemidiaphragm consistent with perforated viscus. Mild gaseous distended small bowel loops mid abdomen. Ileus or bowel obstruction cannot be excluded.  These results were called by telephone at the time of interpretation on 01/22/2015 at 9:16 am to Dr. Bethann BerkshireJOSEPH ZAMMIT , who verbally acknowledged these results.   Electronically Signed   By: Natasha MeadLiviu  Pop M.D.   On: 01/22/2015 09:17    Medications: . enoxaparin (LOVENOX) injection  40 mg Subcutaneous Q24H  . pantoprazole (PROTONIX) IV  40 mg Intravenous Q12H  . piperacillin-tazobactam  3.375 g Intravenous 3 times per day    Assessment/Plan 1.  Perforated duodenal ulcer/peritonitis EXPLORATORY LAPAROTOMY, GRAHAM PATCH CLOSURE OF PERFORATED DUODENAL ULCER, 01/22/15, Dr. Darnell Levelodd Gerkin 2.  Polysubstance use. 3.  Body mass index is 33.9 4.  DVT:  SCD/Lovenox 5.  Day 2 Zosyn   Plan:  Foley out, mobilize some, continue NG drainage, antibiotics, IS and hydration.  He denies any prior issues with withdrawal from substance.  I offered nicotine replacement ,but he thinks he will be fine without it. CBC in AM.   LOS: 1 day    Victor Monroe 01/23/2015

## 2015-01-24 LAB — CBC
HCT: 42.4 % (ref 39.0–52.0)
HEMOGLOBIN: 13.4 g/dL (ref 13.0–17.0)
MCH: 29.2 pg (ref 26.0–34.0)
MCHC: 31.6 g/dL (ref 30.0–36.0)
MCV: 92.4 fL (ref 78.0–100.0)
PLATELETS: 242 10*3/uL (ref 150–400)
RBC: 4.59 MIL/uL (ref 4.22–5.81)
RDW: 14 % (ref 11.5–15.5)
WBC: 13.8 10*3/uL — AB (ref 4.0–10.5)

## 2015-01-24 NOTE — Progress Notes (Signed)
[facility] Progress Note  2 Days Post-Op  Subjective: Pt doing well, up ambulating to the bathroom and in the halls.  Thirsty, NG bothering him.  No flatus or BM yet.  No N/V, abdominal pain well controlled.  Objective: Vital signs in last 24 hours: Temp:  [97.8 F (36.6 C)-98.8 F (37.1 C)] 98.6 F (37 C) (04/15 0618) Pulse Rate:  [51-61] 61 (04/15 0618) Resp:  [18] 18 (04/15 0618) BP: (107-137)/(69-82) 127/80 mmHg (04/15 0618) SpO2:  [100 %] 100 % (04/15 0618) Last BM Date: 01/21/15  Intake/Output from previous day: 04/14 0701 - 04/15 0700 In: 3200 [I.V.:3000; IV Piggyback:200] Out: 2500 [Urine:800; Emesis/NG output:1600; Drains:100] Intake/Output this shift:    PE: Gen:  Alert, NAD, pleasant Abd: Soft, tender over midline incision and over JP drain, diminished BS, no HSM, incisions C/D/I with staples in place, RLQ drain with minimal serosanguinous drainage   Lab Results:   Recent Labs  01/23/15 0430 01/24/15 0418  WBC 20.7* 13.8*  HGB 15.0 13.4  HCT 46.2 42.4  PLT 220 242   BMET  Recent Labs  01/22/15 0832 01/22/15 1720 01/23/15 0430  NA 140  --  139  K 3.8  --  4.3  CL 106  --  107  CO2 26  --  26  GLUCOSE 119*  --  123*  BUN 7  --  8  CREATININE 0.98 1.20 1.05  CALCIUM 9.1  --  8.8   PT/INR  Recent Labs  01/22/15 0948  LABPROT 14.5  INR 1.12   CMP     Component Value Date/Time   NA 139 01/23/2015 0430   K 4.3 01/23/2015 0430   CL 107 01/23/2015 0430   CO2 26 01/23/2015 0430   GLUCOSE 123* 01/23/2015 0430   BUN 8 01/23/2015 0430   CREATININE 1.05 01/23/2015 0430   CALCIUM 8.8 01/23/2015 0430   PROT 7.5 01/22/2015 0832   ALBUMIN 4.2 01/22/2015 0832   AST 33 01/22/2015 0832   ALT 25 01/22/2015 0832   ALKPHOS 56 01/22/2015 0832   BILITOT 1.4* 01/22/2015 0832   GFRNONAA >90 01/23/2015 0430   GFRAA >90 01/23/2015 0430   Lipase     Component Value Date/Time   LIPASE 18 01/22/2015 0832       Studies/Results: Dg  Abd Acute W/chest  01/22/2015   CLINICAL DATA:  Abdominal pain, polysubstance abuse  EXAM: DG ABDOMEN ACUTE W/ 1V CHEST  COMPARISON:  None.  FINDINGS: Cardiomediastinal silhouette is unremarkable. No acute infiltrate or pleural effusion. No pulmonary edema. There is free air under right hemidiaphragm consistent with perforated viscus. Mild gaseous distended small bowel loops mid abdomen. Ileus or bowel obstruction cannot be excluded.  IMPRESSION: No acute infiltrate or pleural effusion. No pulmonary edema. There is free air under right hemidiaphragm consistent with perforated viscus. Mild gaseous distended small bowel loops mid abdomen. Ileus or bowel obstruction cannot be excluded.  These results were called by telephone at the time of interpretation on 01/22/2015 at 9:16 am to Dr. Bethann BerkshireJOSEPH ZAMMIT , who verbally acknowledged these results.   Electronically Signed   By: Natasha MeadLiviu  Pop M.D.   On: 01/22/2015 09:17    Anti-infectives: Anti-infectives    Start     Dose/Rate Route Frequency Ordered Stop   01/22/15 2000  [MAR Hold]  piperacillin-tazobactam (ZOSYN) IVPB 3.375 g  Status:  Discontinued     (MAR Hold since 01/22/15 1218)   3.375 g 12.5 mL/hr over 240 Minutes Intravenous Every 8 hours  01/22/15 1047 01/22/15 1345   01/22/15 2000  piperacillin-tazobactam (ZOSYN) IVPB 3.375 g     3.375 g 12.5 mL/hr over 240 Minutes Intravenous 3 times per day 01/22/15 1345     01/22/15 1400  piperacillin-tazobactam (ZOSYN) IVPB 3.375 g  Status:  Discontinued     3.375 g 12.5 mL/hr over 240 Minutes Intravenous 3 times per day 01/22/15 1036 01/22/15 1045   01/22/15 1100  piperacillin-tazobactam (ZOSYN) IVPB 3.375 g     3.375 g 100 mL/hr over 30 Minutes Intravenous NOW 01/22/15 1046 01/22/15 1212       Assessment/Plan 1. Perforated duodenal ulcer/peritonitis POD #2 s/p EXPLORATORY LAPAROTOMY,GRAHAM PATCH CLOSURE OF PERFORATED DUODENAL ULCER, 01/22/15, Dr. Darnell Level -Foley out yesterday -Ambulate and IS -NG  tube (164mL/24hr), NPO, IVF, pain control, antiemetics -WBC improved to 13.8 today -UGI series to check for leak tomorrow -Labs in AM -Day #3 IV Zosyn 2. Polysubstance use 3. Body mass index is 33.9 4. DVT: SCD/Lovenox    LOS: 2 days    Victor Monroe, Victor Monroe 01/24/2015, 7:30 AM Pager: [phone]

## 2015-01-25 ENCOUNTER — Inpatient Hospital Stay (HOSPITAL_COMMUNITY): Payer: Self-pay

## 2015-01-25 LAB — BASIC METABOLIC PANEL
Anion gap: 7 (ref 5–15)
BUN: 5 mg/dL — ABNORMAL LOW (ref 6–23)
CALCIUM: 9.2 mg/dL (ref 8.4–10.5)
CHLORIDE: 105 mmol/L (ref 96–112)
CO2: 28 mmol/L (ref 19–32)
Creatinine, Ser: 1.25 mg/dL (ref 0.50–1.35)
GFR calc non Af Amer: 76 mL/min — ABNORMAL LOW (ref >90)
GFR, EST AFRICAN AMERICAN: 88 mL/min — AB (ref >90)
GLUCOSE: 103 mg/dL — AB (ref 70–99)
POTASSIUM: 3.6 mmol/L (ref 3.5–5.1)
SODIUM: 140 mmol/L (ref 135–145)

## 2015-01-25 LAB — CBC
HCT: 45.8 % (ref 39.0–52.0)
Hemoglobin: 14.8 g/dL (ref 13.0–17.0)
MCH: 29.8 pg (ref 26.0–34.0)
MCHC: 32.3 g/dL (ref 30.0–36.0)
MCV: 92.2 fL (ref 78.0–100.0)
Platelets: 256 10*3/uL (ref 150–400)
RBC: 4.97 MIL/uL (ref 4.22–5.81)
RDW: 13.6 % (ref 11.5–15.5)
WBC: 11.9 10*3/uL — ABNORMAL HIGH (ref 4.0–10.5)

## 2015-01-25 MED ORDER — IOHEXOL 300 MG/ML  SOLN
50.0000 mL | Freq: Once | INTRAMUSCULAR | Status: AC | PRN
  Administered 2015-01-25: 80 mL via ORAL

## 2015-01-25 NOTE — Progress Notes (Signed)
3 Days Post-Op  Subjective: Not having much pain.  Passing gas.  Objective: Vital signs in last 24 hours: Temp:  [98.2 F (36.8 C)-98.6 F (37 C)] 98.6 F (37 C) (04/16 0557) Pulse Rate:  [57-78] 57 (04/16 0557) Resp:  [18] 18 (04/16 0557) BP: (110-152)/(62-89) 147/83 mmHg (04/16 0557) SpO2:  [97 %-100 %] 100 % (04/16 0557) Last BM Date: 01/21/15  Intake/Output from previous day: 04/15 0701 - 04/16 0700 In: 3200 [I.V.:3000; IV Piggyback:200] Out: 3485 [Urine:2500; Emesis/NG output:950; Drains:35] Intake/Output this shift:    PE: General- In NAD Abdomen-soft, flat, incision clean and intact, serous drain output  Lab Results:   Recent Labs  01/24/15 0418 01/25/15 0536  WBC 13.8* 11.9*  HGB 13.4 14.8  HCT 42.4 45.8  PLT 242 256   BMET  Recent Labs  01/23/15 0430 01/25/15 0536  NA 139 140  K 4.3 3.6  CL 107 105  CO2 26 28  GLUCOSE 123* 103*  BUN 8 5*  CREATININE 1.05 1.25  CALCIUM 8.8 9.2   PT/INR  Recent Labs  01/22/15 0948  LABPROT 14.5  INR 1.12   Comprehensive Metabolic Panel:    Component Value Date/Time   NA 140 01/25/2015 0536   NA 139 01/23/2015 0430   K 3.6 01/25/2015 0536   K 4.3 01/23/2015 0430   CL 105 01/25/2015 0536   CL 107 01/23/2015 0430   CO2 28 01/25/2015 0536   CO2 26 01/23/2015 0430   BUN 5* 01/25/2015 0536   BUN 8 01/23/2015 0430   CREATININE 1.25 01/25/2015 0536   CREATININE 1.05 01/23/2015 0430   GLUCOSE 103* 01/25/2015 0536   GLUCOSE 123* 01/23/2015 0430   CALCIUM 9.2 01/25/2015 0536   CALCIUM 8.8 01/23/2015 0430   AST 33 01/22/2015 0832   ALT 25 01/22/2015 0832   ALKPHOS 56 01/22/2015 0832   BILITOT 1.4* 01/22/2015 0832   PROT 7.5 01/22/2015 0832   ALBUMIN 4.2 01/22/2015 0832     Studies/Results: No results found.  Anti-infectives: Anti-infectives    Start     Dose/Rate Route Frequency Ordered Stop   01/22/15 2000  [MAR Hold]  piperacillin-tazobactam (ZOSYN) IVPB 3.375 g  Status:  Discontinued      (MAR Hold since 01/22/15 1218)   3.375 g 12.5 mL/hr over 240 Minutes Intravenous Every 8 hours 01/22/15 1047 01/22/15 1345   01/22/15 2000  piperacillin-tazobactam (ZOSYN) IVPB 3.375 g     3.375 g 12.5 mL/hr over 240 Minutes Intravenous 3 times per day 01/22/15 1345     01/22/15 1400  piperacillin-tazobactam (ZOSYN) IVPB 3.375 g  Status:  Discontinued     3.375 g 12.5 mL/hr over 240 Minutes Intravenous 3 times per day 01/22/15 1036 01/22/15 1045   01/22/15 1100  piperacillin-tazobactam (ZOSYN) IVPB 3.375 g     3.375 g 100 mL/hr over 30 Minutes Intravenous NOW 01/22/15 1046 01/22/15 1212      Assessment Perforated duodenal ulcer/peritonitis POD #2 s/p EXPLORATORY LAPAROTOMY,GRAHAM PATCH CLOSURE OF PERFORATED DUODENAL ULCER, 01/22/15, Dr. Donney Rankinsodd Gerkin-progressing well; on IV abxs and Protonix; H. Pylori pending  LOS: 3 days   Plan: UGI today.   Victor Monroe 01/25/2015

## 2015-01-25 NOTE — Progress Notes (Addendum)
ANTIBIOTIC CONSULT NOTE - Follow Up  Pharmacy Consult for Zosyn Indication: Intra-abdominal infection  No Known Allergies  Patient Measurements: Height: 5\' 9"  (175.3 cm) Weight: 230 lb (104.327 kg) IBW/kg (Calculated) : 70.7 Adjusted Body Weight:   Vital Signs: Temp: 98.6 F (37 C) (04/16 0557) Temp Source: Oral (04/16 0557) BP: 147/83 mmHg (04/16 0557) Pulse Rate: 57 (04/16 0557) Intake/Output from previous day: 04/15 0701 - 04/16 0700 In: 3200 [I.V.:3000; IV Piggyback:200] Out: 3485 [Urine:2500; Emesis/NG output:950; Drains:35] Intake/Output from this shift:    Labs:  Recent Labs  01/22/15 1720 01/23/15 0430 01/24/15 0418 01/25/15 0536  WBC 21.2* 20.7* 13.8* 11.9*  HGB 17.2* 15.0 13.4 14.8  PLT 256 220 242 256  CREATININE 1.20 1.05  --  1.25   Estimated Creatinine Clearance: 102.8 mL/min (by C-G formula based on Cr of 1.25). No results for input(s): VANCOTROUGH, VANCOPEAK, VANCORANDOM, GENTTROUGH, GENTPEAK, GENTRANDOM, TOBRATROUGH, TOBRAPEAK, TOBRARND, AMIKACINPEAK, AMIKACINTROU, AMIKACIN in the last 72 hours.   Microbiology: No results found for this or any previous visit (from the past 720 hour(s)).  Medical History: Past Medical History  Diagnosis Date  . Polysubstance abuse    Assessment: 89 yoM with PMHx polysubstance abuse presents with "excrutiating abdominal pain" and hematemesis as well as with nausea and vomiting intermittently for 2 months.  DG of abd shows free air with perforated viscus.  S/p ex lap and perforated DU closure 01/22/15.  Pharmacy consulted to dose Zosyn.   4/13 >> Zosyn >>  Tmax: AF WBC: mildly elevated Renal: SCr 1.25, CrCl>100 ml/min  No cultures obtained.  Goal of Therapy:  Doses adjusted per renal function Eradication of infection  Plan:  Continue Zosyn 3.375g IV q8h (4 hour infusion time).  Pharmacy will continue to follow but will sign off from leaving progress notes.  Clance BollAmanda Wynonia Medero, PharmD, BCPS Pager:  [phone] 01/25/2015 2:33 PM

## 2015-01-26 NOTE — Progress Notes (Signed)
4 Days Post-Op  Subjective: Bowels moving.    Objective: Vital signs in last 24 hours: Temp:  [97.9 F (36.6 C)-98.5 F (36.9 C)] 98.5 F (36.9 C) (04/17 0517) Pulse Rate:  [69-84] 84 (04/17 0517) Resp:  [18] 18 (04/17 0517) BP: (138-143)/(74-82) 143/82 mmHg (04/17 0517) SpO2:  [99 %-100 %] 100 % (04/17 0517) Last BM Date: 01/21/15  Intake/Output from previous day: 04/16 0701 - 04/17 0700 In: 3150 [I.V.:3000; IV Piggyback:150] Out: 2590 [Urine:1850; Emesis/NG output:700; Drains:40] Intake/Output this shift:    PE: General- In NAD Abdomen-soft, flat, incision clean and intact, serous drain output  Lab Results:   Recent Labs  01/24/15 0418 01/25/15 0536  WBC 13.8* 11.9*  HGB 13.4 14.8  HCT 42.4 45.8  PLT 242 256   BMET  Recent Labs  01/25/15 0536  NA 140  K 3.6  CL 105  CO2 28  GLUCOSE 103*  BUN 5*  CREATININE 1.25  CALCIUM 9.2   PT/INR No results for input(s): LABPROT, INR in the last 72 hours. Comprehensive Metabolic Panel:    Component Value Date/Time   NA 140 01/25/2015 0536   NA 139 01/23/2015 0430   K 3.6 01/25/2015 0536   K 4.3 01/23/2015 0430   CL 105 01/25/2015 0536   CL 107 01/23/2015 0430   CO2 28 01/25/2015 0536   CO2 26 01/23/2015 0430   BUN 5* 01/25/2015 0536   BUN 8 01/23/2015 0430   CREATININE 1.25 01/25/2015 0536   CREATININE 1.05 01/23/2015 0430   GLUCOSE 103* 01/25/2015 0536   GLUCOSE 123* 01/23/2015 0430   CALCIUM 9.2 01/25/2015 0536   CALCIUM 8.8 01/23/2015 0430   AST 33 01/22/2015 0832   ALT 25 01/22/2015 0832   ALKPHOS 56 01/22/2015 0832   BILITOT 1.4* 01/22/2015 0832   PROT 7.5 01/22/2015 0832   ALBUMIN 4.2 01/22/2015 0832     Studies/Results: Dg Ugi W/water Sol Cm  01/25/2015   CLINICAL DATA:  29 year old male with perforated duodenum ulcer status post graham patch repair. Evaluate for leak.  EXAM: UPPER GI SERIES WITH KUB  TECHNIQUE: After obtaining a scout radiograph a routine upper GI series was performed  using water soluble contrast  FLUOROSCOPY TIME:  If the device does not provide the exposure index:  Fluoroscopy Time (in minutes and seconds):  1 minutes 34 seconds  Number of Acquired Images:  8  COMPARISON:  KUB 01/22/2015  FINDINGS: Initial KUB images demonstrate a nasogastric tube position within the stomach. A surgical drain projects over the left upper quadrant in the region of the proximal duodenum.  Following administration of water soluble contrast material through the nasogastric tube contrast material passes freely into the duodenum and in distally into the proximal small bowel. There is no evidence of contrast extravasation. Multiple spot films were taken in several obliquities to confirm. There is some to and fro motion in the proximal duodenum.  IMPRESSION: No evidence of leak status post graham patch repair of perforated duodenal ulcer.   Electronically Signed   By: Malachy MoanHeath  McCullough M.D.   On: 01/25/2015 12:32    Anti-infectives: Anti-infectives    Start     Dose/Rate Route Frequency Ordered Stop   01/22/15 2000  [MAR Hold]  piperacillin-tazobactam (ZOSYN) IVPB 3.375 g  Status:  Discontinued     (MAR Hold since 01/22/15 1218)   3.375 g 12.5 mL/hr over 240 Minutes Intravenous Every 8 hours 01/22/15 1047 01/22/15 1345   01/22/15 2000  piperacillin-tazobactam (ZOSYN) IVPB 3.375 g  3.375 g 12.5 mL/hr over 240 Minutes Intravenous 3 times per day 01/22/15 1345     01/22/15 1400  piperacillin-tazobactam (ZOSYN) IVPB 3.375 g  Status:  Discontinued     3.375 g 12.5 mL/hr over 240 Minutes Intravenous 3 times per day 01/22/15 1036 01/22/15 1045   01/22/15 1100  piperacillin-tazobactam (ZOSYN) IVPB 3.375 g     3.375 g 100 mL/hr over 30 Minutes Intravenous NOW 01/22/15 1046 01/22/15 1212      Assessment Perforated duodenal ulcer/peritonitis POD #2 s/p EXPLORATORY LAPAROTOMY,GRAHAM PATCH CLOSURE OF PERFORATED DUODENAL ULCER, 01/22/15, Dr. Charolette Forward looks good with no leak; bowel  function returning;  H. Pylori pending                                                                                                                   LOS: 4 days   Plan: Remove ng tube.  Clear liquids.  Continue abxs and Protonix.   Stefano Trulson J 01/26/2015

## 2015-01-27 LAB — CBC
HEMATOCRIT: 47 % (ref 39.0–52.0)
Hemoglobin: 15.5 g/dL (ref 13.0–17.0)
MCH: 29.8 pg (ref 26.0–34.0)
MCHC: 33 g/dL (ref 30.0–36.0)
MCV: 90.2 fL (ref 78.0–100.0)
Platelets: 303 10*3/uL (ref 150–400)
RBC: 5.21 MIL/uL (ref 4.22–5.81)
RDW: 13.2 % (ref 11.5–15.5)
WBC: 10.2 10*3/uL (ref 4.0–10.5)

## 2015-01-27 LAB — COMPREHENSIVE METABOLIC PANEL
ALK PHOS: 42 U/L (ref 39–117)
ALT: 14 U/L (ref 0–53)
AST: 20 U/L (ref 0–37)
Albumin: 3.9 g/dL (ref 3.5–5.2)
Anion gap: 7 (ref 5–15)
BUN: 5 mg/dL — ABNORMAL LOW (ref 6–23)
CO2: 27 mmol/L (ref 19–32)
Calcium: 8.9 mg/dL (ref 8.4–10.5)
Chloride: 102 mmol/L (ref 96–112)
Creatinine, Ser: 1.21 mg/dL (ref 0.50–1.35)
GFR calc Af Amer: >90 mL/min (ref >90)
GFR, EST NON AFRICAN AMERICAN: 79 mL/min — AB (ref >90)
Glucose, Bld: 90 mg/dL (ref 70–99)
POTASSIUM: 4.3 mmol/L (ref 3.5–5.1)
SODIUM: 136 mmol/L (ref 135–145)
Total Bilirubin: 1.8 mg/dL — ABNORMAL HIGH (ref 0.3–1.2)
Total Protein: 7.1 g/dL (ref 6.0–8.3)

## 2015-01-27 NOTE — Discharge Summary (Signed)
Physician Discharge Summary  Patient ID: Victor Monroe MRN: [id number] DOB/AGE: [birth year] 29 y.o.  Admit date: 01/22/2015 Discharge date: 01/28/2015  Admission Diagnoses:  1. Perforated duodenal ulcer/peritonitis 2. Polysubstance use. 3. Body mass index is 33.9  Discharge Diagnoses:   Principal Problem:   Perforated duodenal ulcer Active Problems:   Perforated viscus   PROCEDURES: EXPLORATORY LAPAROTOMY, GRAHAM PATCH CLOSURE OF PERFORATED DUODENAL ULCER, 01/22/15, Dr. Alean Rinneodd Gerkin  Hospital Course:  29 y/o male who reports nausea and vomiting on and off for 2 months. He has blood in his emesis, so he has quit drinking. Unfortunately he continues to have abdominal pain, with nausea and vomiting. He had it last PM when he went to bed, with hematemesis. This morning he woke up and tried to get up to go to bathroom. He reports, "excruciating pain," and was transferred to the hospital. He has not had anything this bad before this AM. Pain is mid epigastric.  Work up in the ED shows he is afebrile, but hypertensive. Labs show elevated bilirubin 1.4. WBC 12.6. H/H 17.6/52. Drug screen: positive for benzodiazepines, marijuana, and cocaine.    Film in the ED shows:There is free air under right hemidiaphragm consistent with perforated viscus. Mild gaseous distended small bowel loops mid abdomen. Ileus or bowel obstruction cannot be excluded.  We were called for the Free air. Pt was admitted and started on IV antibiotics, hydration and taken to the OR for exploration.  Findings include a perforated duodenal ulcer.  This was patched.  He did well thru surgery.  Post op he had an ileus, his diet is being slowly advanced, as bowel function returns.  UGI on 01/25/15 showed no leak of ulcer near perforation site.  He continues to do well we hope to send him home later on today, 01/28/15. He is up to a soft diet Work up here included a positive drug screen, H pylori is still pending  and will need to be addressed as an Outpatient.  He was maintained on IV Zosyn for 6 days, and sent home on 3 more days of Augmentin.    CBC Latest Ref Rng 01/27/2015 01/25/2015 01/24/2015  WBC 4.0 - 10.5 K/uL 10.2 11.9(H) 13.8(H)  Hemoglobin 13.0 - 17.0 g/dL 30.815.5 65.714.8 84.613.4  Hematocrit 39.0 - 52.0 % 47.0 45.8 42.4  Platelets 150 - 400 K/uL 303 256 242   CMP Latest Ref Rng 01/27/2015 01/25/2015 01/23/2015  Glucose 70 - 99 mg/dL 90 962(X103(H) 528(U123(H)  BUN 6 - 23 mg/dL 5(L) 5(L) 8  Creatinine 0.50 - 1.35 mg/dL 1.321.21 4.401.25 1.021.05  Sodium 135 - 145 mmol/L 136 140 139  Potassium 3.5 - 5.1 mmol/L 4.3 3.6 4.3  Chloride 96 - 112 mmol/L 102 105 107  CO2 19 - 32 mmol/L 27 28 26   Calcium 8.4 - 10.5 mg/dL 8.9 9.2 8.8  Total Protein 6.0 - 8.3 g/dL 7.1 - -  Total Bilirubin 0.3 - 1.2 mg/dL 7.2(Z1.8(H) - -  Alkaline Phos 39 - 117 U/L 42 - -  AST 0 - 37 U/L 20 - -  ALT 0 - 53 U/L 14 - -    H Pylori:  Pending  Condition on d/c:  Improved    Disposition: Final discharge disposition not confirmed     Medication List    TAKE these medications        acetaminophen 325 MG tablet  Commonly known as:  TYLENOL  Take 2 tablets (650 mg total) by mouth every 6 (six) hours as needed.  amoxicillin-clavulanate 875-125 MG per tablet  Commonly known as:  AUGMENTIN  Take 1 tablet by mouth every 12 (twelve) hours.     HYDROcodone-acetaminophen 5-325 MG per tablet  Commonly known as:  NORCO/VICODIN  Take 1-2 tablets by mouth every 4 (four) hours as needed for moderate pain.     pantoprazole 40 MG tablet  Commonly known as:  PROTONIX  Take 1 tablet (40 mg total) by mouth daily.       Follow-up Information    Follow up with Velora Heckler, MD On 01/31/2015.   Specialty:  General Surgery   Why:  You need to be at our office for check in at 2:15 for staple removal.   Contact information:   [address] [place] [address] [phone]       Schedule an appointment as soon as possible for a visit  with Velora Heckler, MD.   Specialty:  General Surgery   Why:  CAll and ask for an appointment in 2 weeks.     Contact information:   [address] [place] [address] [phone]       Follow up with Primary care.   Why:  Obtain a Primary Care doctor to follow up on your medical issues.      SignedSherrie George 01/28/2015, 8:18 AM

## 2015-01-27 NOTE — Progress Notes (Signed)
CSW consulted to assist with [facility] resources. Pt not available to meet with CSW, at this time ( bathroom ? ). CSW will return .  Cori RazorJamie Durk Carmen LCSW [phone]

## 2015-01-27 NOTE — Progress Notes (Signed)
5 Days Post-Op  Subjective: He is doing well with clears and has had 2 BM's.  He feels much better.  Objective: Vital signs in last 24 hours: Temp:  [98.3 F (36.8 C)-98.9 F (37.2 C)] 98.4 F (36.9 C) (04/18 0530) Pulse Rate:  [50-84] 53 (04/18 0530) Resp:  [18] 18 (04/18 0530) BP: (126-138)/(69-99) 133/88 mmHg (04/18 0530) SpO2:  [98 %-100 %] 98 % (04/18 0530) Last BM Date: 01/26/15 Diet:  Clears  120 PO recorded 2400 urine 130 from drain recorded 1 BM Afebtrile, VSS Labs OK bilirubin still up some 1.8 WBC OK,  H Pylori is still not back  Intake/Output from previous day: 04/17 0701 - 04/18 0700 In: 2299.2 [P.O.:120; I.V.:2029.2; IV Piggyback:150] Out: 2530 [Urine:2400; Drains:130] Intake/Output this shift: Total I/O In: -  Out: 650 [Urine:650]  General appearance: alert, cooperative and no distress Resp: clear to auscultation bilaterally GI: soft, still sore, but no pain, wound OK, + BS, drain is clear.  Lab Results:   Recent Labs  01/25/15 0536 01/27/15 0755  WBC 11.9* 10.2  HGB 14.8 15.5  HCT 45.8 47.0  PLT 256 303    BMET  Recent Labs  01/25/15 0536 01/27/15 0755  NA 140 136  K 3.6 4.3  CL 105 102  CO2 28 27  GLUCOSE 103* 90  BUN 5* 5*  CREATININE 1.25 1.21  CALCIUM 9.2 8.9   PT/INR No results for input(s): LABPROT, INR in the last 72 hours.   Recent Labs Lab 01/22/15 0832 01/27/15 0755  AST 33 20  ALT 25 14  ALKPHOS 56 42  BILITOT 1.4* 1.8*  PROT 7.5 7.1  ALBUMIN 4.2 3.9     Lipase     Component Value Date/Time   LIPASE 18 01/22/2015 1610     Studies/Results: Dg Ugi W/water Sol Cm  01/25/2015   CLINICAL DATA:  29 year old male with perforated duodenum ulcer status post graham patch repair. Evaluate for leak.  EXAM: UPPER GI SERIES WITH KUB  TECHNIQUE: After obtaining a scout radiograph a routine upper GI series was performed using water soluble contrast  FLUOROSCOPY TIME:  If the device does not provide the exposure index:   Fluoroscopy Time (in minutes and seconds):  1 minutes 34 seconds  Number of Acquired Images:  8  COMPARISON:  KUB 01/22/2015  FINDINGS: Initial KUB images demonstrate a nasogastric tube position within the stomach. A surgical drain projects over the left upper quadrant in the region of the proximal duodenum.  Following administration of water soluble contrast material through the nasogastric tube contrast material passes freely into the duodenum and in distally into the proximal small bowel. There is no evidence of contrast extravasation. Multiple spot films were taken in several obliquities to confirm. There is some to and fro motion in the proximal duodenum.  IMPRESSION: No evidence of leak status post graham patch repair of perforated duodenal ulcer.   Electronically Signed   By: Malachy Moan M.D.   On: 01/25/2015 12:32    Medications: . enoxaparin (LOVENOX) injection  40 mg Subcutaneous Q24H  . pantoprazole (PROTONIX) IV  40 mg Intravenous Q12H  . piperacillin-tazobactam  3.375 g Intravenous 3 times per day    Assessment/Plan 1. Perforated duodenal ulcer/peritonitis EXPLORATORY LAPAROTOMY, GRAHAM PATCH CLOSURE OF PERFORATED DUODENAL ULCER, 01/22/15, Dr. Darnell Level 2. Polysubstance use. 3. Body mass index is 33.9 4. DVT: SCD/Lovenox 5. Day  Zosyn 6  Plan:  Full liquids today and soft diet in Am.  Advance and if  Ok home tomorrow, I will check on duration of antibiotics.    LOS: 5 days    Victor Monroe 01/27/2015

## 2015-01-28 ENCOUNTER — Encounter: Payer: Self-pay | Admitting: General Surgery

## 2015-01-28 LAB — BENZODIAZEPINE, QUANTITATIVE, URINE
ALPRAZOLAMU: NEGATIVE ng/mL (ref <25)
CLONAZEPAU: NEGATIVE ng/mL (ref <25)
FLURAZEPAM GC/MS CONF: NEGATIVE ng/mL (ref <50)
Lorazepam (GC/LC/MS), ur confirm: NEGATIVE ng/mL (ref <50)
MIDAZOLAMU: 1341 ng/mL — AB (ref <50)
Nordiazepam GC/MS Conf: NEGATIVE ng/mL (ref <50)
Oxazepam GC/MS Conf: NEGATIVE ng/mL (ref <50)
TRIAZOLAMU: NEGATIVE ng/mL (ref <50)
Temazepam GC/MS Conf: NEGATIVE ng/mL (ref <50)

## 2015-01-28 LAB — THC (MARIJUANA), URINE, CONFIRMATION: Marijuana, Ur-Confirmation: 539 ng/mL — ABNORMAL HIGH (ref <5)

## 2015-01-28 LAB — COCAINE, URINE, CONFIRMATION: Benzoylecgonine GC/MS Conf: 16094 ng/mL — ABNORMAL HIGH (ref <100)

## 2015-01-28 MED ORDER — PANTOPRAZOLE SODIUM 40 MG PO TBEC
40.0000 mg | DELAYED_RELEASE_TABLET | Freq: Two times a day (BID) | ORAL | Status: DC
  Administered 2015-01-28: 40 mg via ORAL
  Filled 2015-01-28: qty 1

## 2015-01-28 MED ORDER — PANTOPRAZOLE SODIUM 40 MG PO TBEC: 40.0000 mg | DELAYED_RELEASE_TABLET | Freq: Every day | ORAL | Status: DC

## 2015-01-28 MED ORDER — ACETAMINOPHEN 325 MG PO TABS: 650.0000 mg | ORAL_TABLET | Freq: Four times a day (QID) | ORAL | Status: DC | PRN

## 2015-01-28 MED ORDER — HYDROCODONE-ACETAMINOPHEN 5-325 MG PO TABS: 1.0000 | ORAL_TABLET | ORAL | Status: DC | PRN

## 2015-01-28 MED ORDER — AMOXICILLIN-POT CLAVULANATE 875-125 MG PO TABS
1.0000 | ORAL_TABLET | Freq: Two times a day (BID) | ORAL | Status: DC
  Administered 2015-01-28: 1 via ORAL
  Filled 2015-01-28 (×2): qty 1

## 2015-01-28 MED ORDER — AMOXICILLIN-POT CLAVULANATE 875-125 MG PO TABS: 1.0000 | ORAL_TABLET | Freq: Two times a day (BID) | ORAL | Status: DC

## 2015-01-28 NOTE — Discharge Instructions (Signed)
[facility]      [facility] [phone]  OPEN ABDOMINAL SURGERY: POST OP INSTRUCTIONS  Always review your discharge instruction sheet given to you by the facility where your surgery was performed.  IF YOU HAVE DISABILITY OR FAMILY LEAVE FORMS, YOU MUST BRING THEM TO THE OFFICE FOR PROCESSING.  PLEASE DO NOT GIVE THEM TO YOUR DOCTOR.  1. A prescription for pain medication may be given to you upon discharge.  Take your pain medication as prescribed, if needed.  If narcotic pain medicine is not needed, then you may take acetaminophen (Tylenol) or ibuprofen (Advil) as needed. 2. Take your usually prescribed medications unless otherwise directed. 3. If you need a refill on your pain medication, please contact your pharmacy. They will contact our office to request authorization.  Prescriptions will not be filled after 5pm or on week-ends. 4. You should follow a light diet the first few days after arrival home, such as soup and crackers, pudding, etc.unless your doctor has advised otherwise. A high-fiber, low fat diet can be resumed as tolerated.   Be sure to include lots of fluids daily. Most patients will experience some swelling and bruising on the chest and neck area.  Ice packs will help.  Swelling and bruising can take several days to resolve 5. Most patients will experience some swelling and bruising in the area of the incision. Ice pack will help. Swelling and bruising can take several days to resolve..  6. It is common to experience some constipation if taking pain medication after surgery.  Increasing fluid intake and taking a stool softener will usually help or prevent this problem from occurring.  A mild laxative (Milk of Magnesia or Miralax) should be taken according to package directions if there are no bowel movements after 48 hours. 7.  You may have steri-strips (small skin tapes) in place directly over the incision.  These strips should be left on the skin for 7-10 days.  If your  surgeon used skin glue on the incision, you may shower in 24 hours.  The glue will flake off over the next 2-3 weeks.  Any sutures or staples will be removed at the office during your follow-up visit. You may find that a light gauze bandage over your incision may keep your staples from being rubbed or pulled. You may shower and replace the bandage daily. 8. ACTIVITIES:  You may resume regular (light) daily activities beginning the next day--such as daily self-care, walking, climbing stairs--gradually increasing activities as tolerated.  You may have sexual intercourse when it is comfortable.  Refrain from any heavy lifting or straining until approved by your doctor. a. You may drive when you no longer are taking prescription pain medication, you can comfortably wear a seatbelt, and you can safely maneuver your car and apply brakes b. Return to Work: ___________________________________ 9. You should see your doctor in the office for a follow-up appointment approximately two weeks after your surgery.  Make sure that you call for this appointment within a day or two after you arrive home to insure a convenient appointment time. OTHER INSTRUCTIONS:  _____________________________________________________________ _____________________________________________________________  WHEN TO CALL YOUR DOCTOR: 1. Fever over 101.0 2. Inability to urinate 3. Nausea and/or vomiting 4. Extreme swelling or bruising 5. Continued bleeding from incision. 6. Increased pain, redness, or drainage from the incision. 7. Difficulty swallowing or breathing 8. Muscle cramping or spasms. 9. Numbness or tingling in hands or feet or around lips.  The clinic staff is available to   answer your questions during regular business hours.  Please don't hesitate to call and ask to speak to one of the nurses if you have concerns.  For further questions, please visit [email or web address]   

## 2015-01-28 NOTE — Progress Notes (Signed)
Clinical Social Work Department BRIEF PSYCHOSOCIAL ASSESSMENT 01/28/2015  Patient:  Victor HeritageSPRINGFIELD,Lyam     Account Number:  [id number]     Admit date:  01/22/2015  Clinical Social Worker:  Candie ChromanHAIDINGER,Thomasine Klutts, LCSW  Date/Time:  01/28/2015 09:54 AM  Referred by:  Physician  Date Referred:  01/27/2015 Referred for  Substance Abuse   Other Referral:   Interview type:  Patient Other interview type:    PSYCHOSOCIAL DATA Living Status:  PARENTS Admitted from facility:   Level of care:   Primary support name:  Victor Monroe Primary support relationship to patient:  PARENT Degree of support available:   Very supportive.    CURRENT CONCERNS Current Concerns  Substance Abuse   Other Concerns:    SOCIAL WORK ASSESSMENT / PLAN Pt is a 29 yr old gentleman living at home prior to hospitalization. CSW consulted to offer assistance with SA resources. PN reviewed. SBIRT Assessment offered and declined. Pt dx with a perforated duodenal ulcer/peritonitis. Pt reports he had been having stomach problems,(  N/V )for months,  prior to admission. Pt reports he abuses drugs / alcohol. Pt accepted resource list though declined further CSW assistance. Pt hopes to return home today.   Assessment/plan status:  No Further Intervention Required Other assessment/ plan:   Information/referral to community resources:   [place] resource [place].    PATIENT'S/FAMILY'S RESPONSE TO PLAN OF CARE: Pt's mood is bright. " I can't tell you how bad the pain was. I didn't want to deal with it. I wanted it to just go away. My mother had been telling me I needed to go to the hospital. Finally, when I couldn't take it anymore, I called the ambulance."  Pt reports that he will no longer use drugs/alcohol after this experience. " I don't need help. This is an eye opening experience. " CSW encouraged pt to hold on to the resource list. CSW explained to pt once he starts feeling better, and some time goes by, he may consider returning to old  habits. Pt agreed to keep list and use it as a reminder of this experience.    Cori RazorJamie Blayn Whetsell LCSW [phone]

## 2015-01-28 NOTE — Progress Notes (Signed)
Assessment unchanged. No complaints voiced. Pt verbalized understanding of dc instructions through teach back including follow up care, medication and when to call MD. Discharged via foot per request to 2nd floor to meet bus for transport home. [place]

## 2015-01-28 NOTE — Progress Notes (Signed)
6 Days Post-Op  Subjective: He looks and feels good.  Incisions look fine, he is ready for soft diet.  + BM yesterday.  Feels much better about everything.  Objective: Vital signs in last 24 hours: Temp:  [97.8 F (36.6 C)-98 F (36.7 C)] 97.8 F (36.6 C) (04/19 0600) Pulse Rate:  [50-54] 50 (04/19 0600) Resp:  [18] 18 (04/19 0600) BP: (122-132)/(73-81) 126/76 mmHg (04/19 0600) SpO2:  [100 %] 100 % (04/19 0600) Last BM Date: 01/26/15 85 from drain 480 PO recorded Afebrile, VSS No labs today Soft diet at breakfast H pylori still pending Intake/Output from previous day: 04/18 0701 - 04/19 0700 In: 1901.3 [P.O.:480; I.V.:1321.3; IV Piggyback:100] Out: 2635 [Urine:2550; Drains:85] Intake/Output this shift:    General appearance: alert, cooperative and no distress Resp: clear to auscultation bilaterally GI: soft, no real complaints of pain.  incision looks fine, drain is clear.  Lab Results:   Recent Labs  01/27/15 0755  WBC 10.2  HGB 15.5  HCT 47.0  PLT 303    BMET  Recent Labs  01/27/15 0755  NA 136  K 4.3  CL 102  CO2 27  GLUCOSE 90  BUN 5*  CREATININE 1.21  CALCIUM 8.9   PT/INR No results for input(s): LABPROT, INR in the last 72 hours.   Recent Labs Lab 01/22/15 0832 01/27/15 0755  AST 33 20  ALT 25 14  ALKPHOS 56 42  BILITOT 1.4* 1.8*  PROT 7.5 7.1  ALBUMIN 4.2 3.9     Lipase     Component Value Date/Time   LIPASE 18 01/22/2015 0832     Studies/Results: No results found.  Medications: . enoxaparin (LOVENOX) injection  40 mg Subcutaneous Q24H  . pantoprazole (PROTONIX) IV  40 mg Intravenous Q12H  . piperacillin-tazobactam  3.375 g Intravenous 3 times per day    Assessment/Plan 1. Perforated duodenal ulcer/peritonitis EXPLORATORY LAPAROTOMY, GRAHAM PATCH CLOSURE OF PERFORATED DUODENAL ULCER, 01/22/15, Dr. Darnell Victor Monroe 2. Polysubstance use. 3. Body mass index is 33.9 4. DVT: SCD/Lovenox 5. Day Zosyn 7   Plan:  He is  starting a soft diet this AM, if he does well I will let him go home after lunch.  He has another BM yesterday without issue.  I will d/c drain this AM.  Switch him over to Augmentin.  H pylori is still pending. Staples out on 01/31/15 @ 2:15 PM.  Change PPI to PO today also.    LOS: 6 days    Victor Monroe 01/28/2015

## 2015-02-03 LAB — H. PYLORI ANTIBODY, IGG: H Pylori IgG: 3

## 2015-02-05 LAB — H. PYLORI ANTIBODY, IGG: H Pylori IgG: 2.6

## 2016-01-08 ENCOUNTER — Emergency Department (HOSPITAL_COMMUNITY)
Admission: EM | Admit: 2016-01-08 | Discharge: 2016-01-08 | Disposition: A | Discharge status: Home / Self Care | Payer: No Typology Code available for payment source | Attending: Emergency Medicine | Admitting: Emergency Medicine

## 2016-01-08 ENCOUNTER — Encounter (HOSPITAL_COMMUNITY): Payer: Self-pay

## 2016-01-08 DIAGNOSIS — S0990XA Unspecified injury of head, initial encounter: Secondary | ICD-10-CM | POA: Insufficient documentation

## 2016-01-08 DIAGNOSIS — F17200 Nicotine dependence, unspecified, uncomplicated: Secondary | ICD-10-CM | POA: Insufficient documentation

## 2016-01-08 DIAGNOSIS — Y998 Other external cause status: Secondary | ICD-10-CM | POA: Insufficient documentation

## 2016-01-08 DIAGNOSIS — Y92410 Unspecified street and highway as the place of occurrence of the external cause: Secondary | ICD-10-CM | POA: Diagnosis not present

## 2016-01-08 DIAGNOSIS — Y9389 Activity, other specified: Secondary | ICD-10-CM | POA: Insufficient documentation

## 2016-01-08 DIAGNOSIS — S199XXA Unspecified injury of neck, initial encounter: Secondary | ICD-10-CM | POA: Diagnosis not present

## 2016-01-08 DIAGNOSIS — Z792 Long term (current) use of antibiotics: Secondary | ICD-10-CM | POA: Insufficient documentation

## 2016-01-08 DIAGNOSIS — Z79899 Other long term (current) drug therapy: Secondary | ICD-10-CM | POA: Diagnosis not present

## 2016-01-08 MED ORDER — HYDROCODONE-ACETAMINOPHEN 5-325 MG PO TABS: 1.0000 | ORAL_TABLET | Freq: Four times a day (QID) | ORAL | Status: DC | PRN

## 2016-01-08 NOTE — ED Notes (Signed)
Declined W/C at D/C and was escorted to lobby by RN. 
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
## 2016-03-15 ENCOUNTER — Encounter (HOSPITAL_COMMUNITY): Payer: Self-pay | Admitting: Nurse Practitioner

## 2016-03-15 ENCOUNTER — Emergency Department (HOSPITAL_COMMUNITY)
Admission: EM | Admit: 2016-03-15 | Discharge: 2016-03-15 | Disposition: A | Discharge status: Home / Self Care | Payer: No Typology Code available for payment source | Attending: Emergency Medicine | Admitting: Emergency Medicine

## 2016-03-15 DIAGNOSIS — F17200 Nicotine dependence, unspecified, uncomplicated: Secondary | ICD-10-CM | POA: Insufficient documentation

## 2016-03-15 DIAGNOSIS — Z79899 Other long term (current) drug therapy: Secondary | ICD-10-CM | POA: Insufficient documentation

## 2016-03-15 DIAGNOSIS — K2970 Gastritis, unspecified, without bleeding: Secondary | ICD-10-CM | POA: Insufficient documentation

## 2016-03-15 LAB — COMPREHENSIVE METABOLIC PANEL
ALT: 25 U/L (ref 17–63)
ANION GAP: 9 (ref 5–15)
AST: 27 U/L (ref 15–41)
Albumin: 4.4 g/dL (ref 3.5–5.0)
Alkaline Phosphatase: 56 U/L (ref 38–126)
BILIRUBIN TOTAL: 1.1 mg/dL (ref 0.3–1.2)
BUN: 9 mg/dL (ref 6–20)
CALCIUM: 10 mg/dL (ref 8.9–10.3)
CO2: 28 mmol/L (ref 22–32)
Chloride: 103 mmol/L (ref 101–111)
Creatinine, Ser: 1.21 mg/dL (ref 0.61–1.24)
GFR calc Af Amer: >60 mL/min (ref >60)
Glucose, Bld: 85 mg/dL (ref 65–99)
POTASSIUM: 4.2 mmol/L (ref 3.5–5.1)
Sodium: 140 mmol/L (ref 135–145)
TOTAL PROTEIN: 7.4 g/dL (ref 6.5–8.1)

## 2016-03-15 LAB — CBC
HCT: 47.7 % (ref 39.0–52.0)
Hemoglobin: 16 g/dL (ref 13.0–17.0)
MCH: 29.4 pg (ref 26.0–34.0)
MCHC: 33.5 g/dL (ref 30.0–36.0)
MCV: 87.5 fL (ref 78.0–100.0)
Platelets: 261 10*3/uL (ref 150–400)
RBC: 5.45 MIL/uL (ref 4.22–5.81)
RDW: 13.9 % (ref 11.5–15.5)
WBC: 15.3 10*3/uL — AB (ref 4.0–10.5)

## 2016-03-15 LAB — URINALYSIS, ROUTINE W REFLEX MICROSCOPIC
Bilirubin Urine: NEGATIVE
Glucose, UA: NEGATIVE mg/dL
Hgb urine dipstick: NEGATIVE
KETONES UR: NEGATIVE mg/dL
LEUKOCYTES UA: NEGATIVE
NITRITE: NEGATIVE
Protein, ur: NEGATIVE mg/dL
Specific Gravity, Urine: 1.03 (ref 1.005–1.030)
pH: 5.5 (ref 5.0–8.0)

## 2016-03-15 LAB — LIPASE, BLOOD: LIPASE: 20 U/L (ref 11–51)

## 2016-03-15 MED ORDER — GI COCKTAIL ~~LOC~~
30.0000 mL | Freq: Once | ORAL | Status: AC
  Administered 2016-03-15: 30 mL via ORAL
  Filled 2016-03-15: qty 30

## 2016-03-15 MED ORDER — SUCRALFATE 1 GM/10ML PO SUSP: 1.0000 g | Freq: Three times a day (TID) | ORAL | Status: DC

## 2016-03-15 MED ORDER — FAMOTIDINE 20 MG PO TABS
20.0000 mg | ORAL_TABLET | Freq: Once | ORAL | Status: AC
  Administered 2016-03-15: 20 mg via ORAL
  Filled 2016-03-15: qty 1

## 2016-03-15 MED ORDER — PANTOPRAZOLE SODIUM 40 MG PO TBEC: 40.0000 mg | DELAYED_RELEASE_TABLET | Freq: Every day | ORAL | Status: DC

## 2016-03-15 NOTE — Discharge Instructions (Signed)
He is going to the wellness Center before 9 AM see you can be seen and we can get you connect him with a gastroenterologist.  Stay away from all NSAIDs (aspirin, naproxen, ibuprofen, Motrin, Goody powder etc.) as they will irritate the lining of your stomach.

## 2016-03-15 NOTE — ED Notes (Signed)
Wanda, case manager, at bedside  

## 2016-03-15 NOTE — ED Notes (Signed)
Spoke with main lab, they will add on lipase. 

## 2016-03-15 NOTE — Care Management (Addendum)
ED CM spoke with patient at bedside to discuss follow up. Patient reports not having health insurance or a PCP. [facility] and services provided. Patient is agreeable with establishing care at the clinic. Appt arranged for patient to go to walk-in clinic on Thursday 6/8 at 9a.Patient verbalized understanding teach back done. Patient denies any further question or concerns. No further ED CM needs identified.

## 2016-03-15 NOTE — ED Notes (Signed)
Pt c/o 3-4 day history abd pain, nausea. States he "spits up saliva" but has been unable to vomit. Denies fevers, bowel/bladder changes. Alert and breathing easily

## 2016-03-15 NOTE — ED Provider Notes (Signed)
CSN: [id number]     Arrival date & time 03/15/16  1416 History   First MD Initiated Contact with Patient 03/15/16 1805     Chief Complaint  Patient presents with  . Abdominal Pain     (Consider location/radiation/quality/duration/timing/severity/associated sxs/prior Treatment) HPI   Blood pressure 147/85, pulse 85, temperature 98.2 F (36.8 C), temperature source Oral, resp. rate 18, height 5\' 9"  (1.753 m), SpO2 100 %.  Victor Monroe is a 30 y.o. male complaining of  severe epigastric abdominal pain worsening over the course of 4 days with associated nausea and no vomiting, patient is having normal urination and defecation. Of note patient had duodenal ulcer perforation with peritonitis and exploratory laparoscopy in April 2016. He has not followed with GI, he doesn't take any antacids.  Past Medical History  Diagnosis Date  . Polysubstance abuse    Past Surgical History  Procedure Laterality Date  . Laparotomy N/A 01/22/2015    Procedure: EXPLORATORY LAPAROTOMY,  GRAHAM PATCH CLOSURE OF PERFORATED DUODENAL ULCER;  Surgeon: Darnell Levelodd Gerkin, MD;  Location: [facility];  Service: General;  Laterality: N/A;  . Abdominal surgery     History reviewed. No pertinent family history. Social History  Substance Use Topics  . Smoking status: Current Every Day Smoker  . Smokeless tobacco: None  . Alcohol Use: Yes    Review of Systems   10 systems reviewed and found to be negative, except as noted in the HPI.   Allergies  Review of patient's allergies indicates no known allergies.  Home Medications   Prior to Admission medications   Medication Sig Start Date End Date Taking? Authorizing Provider  acetaminophen (TYLENOL) 325 MG tablet Take 2 tablets (650 mg total) by mouth every 6 (six) hours as needed. 01/28/15   Sherrie GeorgeWillard Jennings, PA-C  amoxicillin-clavulanate (AUGMENTIN) 875-125 MG per tablet Take 1 tablet by mouth every 12 (twelve) hours. 01/28/15   Sherrie GeorgeWillard Jennings, PA-C   HYDROcodone-acetaminophen (NORCO/VICODIN) 5-325 MG tablet Take 1-2 tablets by mouth every 6 (six) hours as needed. 01/08/16   Roxy Horsemanobert Browning, PA-C  pantoprazole (PROTONIX) 40 MG tablet Take 1 tablet (40 mg total) by mouth daily. 01/28/15   Sherrie GeorgeWillard Jennings, PA-C   BP 147/85 mmHg  Pulse 85  Temp(Src) 98.2 F (36.8 C) (Oral)  Resp 18  Ht 5\' 9"  (1.753 m)  SpO2 100% Physical Exam  Constitutional: He is oriented to person, place, and time. He appears well-developed and well-nourished. No distress.  HENT:  Head: Normocephalic and atraumatic.  Mouth/Throat: Oropharynx is clear and moist.  Eyes: Conjunctivae and EOM are normal. Pupils are equal, round, and reactive to light.  Neck: Normal range of motion.  Cardiovascular: Normal rate, regular rhythm and intact distal pulses.   Pulmonary/Chest: Effort normal and breath sounds normal.  Abdominal: Soft. Bowel sounds are normal. He exhibits no distension and no mass. There is no tenderness. There is no rebound and no guarding.  No tenderness to deep palpation of any quadrant.  Musculoskeletal: Normal range of motion.  Neurological: He is alert and oriented to person, place, and time.  Skin: He is not diaphoretic.  Psychiatric: He has a normal mood and affect.  Nursing note and vitals reviewed.   ED Course  Procedures (including critical care time) Labs Review Labs Reviewed  CBC - Abnormal; Notable for the following:    WBC 15.3 (*)    All other components within normal limits  COMPREHENSIVE METABOLIC PANEL  URINALYSIS, ROUTINE W REFLEX MICROSCOPIC (NOT AT [facility])  LIPASE, BLOOD  Imaging Review No results found. I have personally reviewed and evaluated these images and lab results as part of my medical decision-making.   EKG Interpretation None      MDM   Final diagnoses:  Gastritis    Filed Vitals:   03/15/16 1442 03/15/16 1845  BP: 147/85 117/70  Pulse: 85 70  Temp: 98.2 F (36.8 C)   TempSrc: Oral   Resp: 18    Height:  (1.753 m)   SpO2: 100% 98%    Medications  famotidine (PEPCID) tablet 20 mg (20 mg Oral Given 03/15/16 1825)  gi cocktail (Maalox,Lidocaine,Donnatal) (30 mLs Oral Given 03/15/16 1825)    Victor Monroe is 30 y.o. male presenting with Epigastric abdominal pain with nausea, this patient had a perforated duodenal ulcer with peritonitis 1 year ago, he has not followed with GI. Patient declines fecal occult blood testing. Abdominal exam is benign, patient is overall very well appearing. Blood work reassuring with a mild leukocytosis. Patient will be started on proton X and Carafate, case management has encouraged patient to follow with the wellness Center so we can try to get him connected with gastroenterology. Extensive discussion of return precautions.  Evaluation does not show pathology that would require ongoing emergent intervention or inpatient treatment. Pt is hemodynamically stable and mentating appropriately. Discussed findings and plan with patient/guardian, who agrees with care plan. All questions answered. Return precautions discussed and outpatient follow up given.   Discharge Medication List as of 03/15/2016  7:38 PM    START taking these medications   Details  sucralfate (CARAFATE) 1 GM/10ML suspension Take 10 mLs (1 g total) by mouth 4 (four) times daily -  with meals and at bedtime., Starting 03/15/2016, Until Discontinued, Print             Wynetta Emery, PA-C 03/16/16 1134  Pricilla Loveless, MD 03/16/16 [phone]

## 2016-03-16 ENCOUNTER — Telehealth: Payer: Self-pay | Admitting: Surgery

## 2016-03-16 NOTE — Telephone Encounter (Signed)
ED CM attempted to reach patient by phone with change date and time of [facility] to Wednesday 6/7 at 3p  No answer unable to LVM.

## 2016-03-17 ENCOUNTER — Inpatient Hospital Stay: Payer: Self-pay | Admitting: Internal Medicine

## 2016-09-01 ENCOUNTER — Encounter (HOSPITAL_COMMUNITY): Payer: Self-pay | Admitting: Nurse Practitioner

## 2016-09-01 ENCOUNTER — Emergency Department (HOSPITAL_COMMUNITY)
Admission: EM | Admit: 2016-09-01 | Discharge: 2016-09-01 | Disposition: A | Discharge status: Home / Self Care | Payer: No Typology Code available for payment source | Attending: Emergency Medicine | Admitting: Emergency Medicine

## 2016-09-01 DIAGNOSIS — M7918 Myalgia, other site: Secondary | ICD-10-CM

## 2016-09-01 DIAGNOSIS — Y92410 Unspecified street and highway as the place of occurrence of the external cause: Secondary | ICD-10-CM | POA: Diagnosis not present

## 2016-09-01 DIAGNOSIS — R51 Headache: Secondary | ICD-10-CM | POA: Insufficient documentation

## 2016-09-01 DIAGNOSIS — Z79899 Other long term (current) drug therapy: Secondary | ICD-10-CM | POA: Diagnosis not present

## 2016-09-01 DIAGNOSIS — Y999 Unspecified external cause status: Secondary | ICD-10-CM | POA: Diagnosis not present

## 2016-09-01 DIAGNOSIS — Y939 Activity, unspecified: Secondary | ICD-10-CM | POA: Diagnosis not present

## 2016-09-01 DIAGNOSIS — F17200 Nicotine dependence, unspecified, uncomplicated: Secondary | ICD-10-CM | POA: Insufficient documentation

## 2016-09-01 MED ORDER — IBUPROFEN 600 MG PO TABS: 600.0000 mg | ORAL_TABLET | Freq: Four times a day (QID) | ORAL | 0 refills | Status: DC | PRN

## 2016-09-01 MED ORDER — KETOROLAC TROMETHAMINE 30 MG/ML IJ SOLN
30.0000 mg | Freq: Once | INTRAMUSCULAR | Status: AC
  Administered 2016-09-01: 30 mg via INTRAMUSCULAR
  Filled 2016-09-01: qty 1

## 2016-09-01 MED ORDER — METHOCARBAMOL 500 MG PO TABS: 500.0000 mg | ORAL_TABLET | Freq: Two times a day (BID) | ORAL | 0 refills | Status: DC
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
## 2017-02-24 ENCOUNTER — Emergency Department (HOSPITAL_COMMUNITY)
Admission: EM | Admit: 2017-02-24 | Discharge: 2017-02-24 | Disposition: A | Discharge status: Home / Self Care | Payer: No Typology Code available for payment source | Attending: Emergency Medicine | Admitting: Emergency Medicine

## 2017-02-24 ENCOUNTER — Emergency Department (HOSPITAL_COMMUNITY): Payer: No Typology Code available for payment source

## 2017-02-24 ENCOUNTER — Encounter (HOSPITAL_COMMUNITY): Payer: Self-pay | Admitting: Emergency Medicine

## 2017-02-24 DIAGNOSIS — Z79899 Other long term (current) drug therapy: Secondary | ICD-10-CM | POA: Insufficient documentation

## 2017-02-24 DIAGNOSIS — Y92481 Parking lot as the place of occurrence of the external cause: Secondary | ICD-10-CM | POA: Diagnosis not present

## 2017-02-24 DIAGNOSIS — S31010A Laceration without foreign body of lower back and pelvis without penetration into retroperitoneum, initial encounter: Secondary | ICD-10-CM | POA: Insufficient documentation

## 2017-02-24 DIAGNOSIS — F17200 Nicotine dependence, unspecified, uncomplicated: Secondary | ICD-10-CM | POA: Insufficient documentation

## 2017-02-24 DIAGNOSIS — Y939 Activity, unspecified: Secondary | ICD-10-CM | POA: Diagnosis not present

## 2017-02-24 DIAGNOSIS — S3992XA Unspecified injury of lower back, initial encounter: Secondary | ICD-10-CM | POA: Diagnosis present

## 2017-02-24 DIAGNOSIS — Z23 Encounter for immunization: Secondary | ICD-10-CM | POA: Insufficient documentation

## 2017-02-24 DIAGNOSIS — M5136 Other intervertebral disc degeneration, lumbar region: Secondary | ICD-10-CM | POA: Insufficient documentation

## 2017-02-24 DIAGNOSIS — M545 Low back pain, unspecified: Secondary | ICD-10-CM

## 2017-02-24 DIAGNOSIS — Y990 Civilian activity done for income or pay: Secondary | ICD-10-CM | POA: Diagnosis not present

## 2017-02-24 DIAGNOSIS — S300XXA Contusion of lower back and pelvis, initial encounter: Secondary | ICD-10-CM

## 2017-02-24 DIAGNOSIS — S20411A Abrasion of right back wall of thorax, initial encounter: Secondary | ICD-10-CM

## 2017-02-24 MED ORDER — TETANUS-DIPHTH-ACELL PERTUSSIS 5-2.5-18.5 LF-MCG/0.5 IM SUSP
0.5000 mL | Freq: Once | INTRAMUSCULAR | Status: AC
  Administered 2017-02-24: 0.5 mL via INTRAMUSCULAR
  Filled 2017-02-24: qty 0.5

## 2017-02-24 MED ORDER — HYDROCODONE-ACETAMINOPHEN 5-325 MG PO TABS
1.0000 | ORAL_TABLET | Freq: Once | ORAL | Status: AC
Start: 2017-02-24 — End: 2017-02-24
  Administered 2017-02-24: 1 via ORAL
  Filled 2017-02-24: qty 1

## 2017-02-24 MED ORDER — CYCLOBENZAPRINE HCL 10 MG PO TABS: 10.0000 mg | ORAL_TABLET | Freq: Three times a day (TID) | ORAL | 0 refills | Status: DC | PRN
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
## 2017-02-24 NOTE — Discharge Instructions (Signed)
Take ibuprofen as directed for inflammation and pain with tylenol for breakthrough pain and flexeril for muscle relaxation. Do not drive or operate machinery with muscle relaxant use. Ice to areas of soreness, no more than 20 minutes at a time every hour. Keep wound clean with mild soap and water. Keep area covered with a topical antibiotic ointment and bandage, keep bandage dry. Follow up with your primary care doctor or the [facility] in approximately 7 days for wound recheck and recheck of symptoms. Monitor area for signs of infection to include, but not limited to: increasing pain, spreading redness, drainage/pus, worsening swelling, or fevers. Expect to be sore for the next few days and follow up with primary care physician for recheck of ongoing symptoms in the next 1 week. Return to ER for emergent changing or worsening of symptoms.
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
## 2018-01-14 ENCOUNTER — Ambulatory Visit (HOSPITAL_COMMUNITY)
Admission: EM | Admit: 2018-01-14 | Discharge: 2018-01-14 | Disposition: A | Discharge status: Home / Self Care | Payer: Self-pay | Attending: Internal Medicine | Admitting: Internal Medicine

## 2018-01-14 ENCOUNTER — Encounter (HOSPITAL_COMMUNITY): Payer: Self-pay | Admitting: Emergency Medicine

## 2018-01-14 DIAGNOSIS — K029 Dental caries, unspecified: Secondary | ICD-10-CM

## 2018-01-14 MED ORDER — HYDROCODONE-ACETAMINOPHEN 5-325 MG PO TABS: 1.0000 | ORAL_TABLET | ORAL | 0 refills | Status: AC | PRN

## 2018-01-14 MED ORDER — PENICILLIN V POTASSIUM 500 MG PO TABS: 500.0000 mg | ORAL_TABLET | Freq: Three times a day (TID) | ORAL | 0 refills | Status: DC

## 2018-01-14 NOTE — ED Provider Notes (Signed)
[facility]    CSN: [id number] Arrival date & time: 01/14/18  1842     History   Chief Complaint Chief Complaint  Patient presents with  . Dental Pain    HPI Victor Monroe is a 32 y.o. male.   32 year old male, presenting today complaining of left-sided dental pain and facial swelling.  Symptoms started approximately 2 weeks ago.  States that he has been using Orajel and ibuprofen without much relief.  He does not have a dentist.  The history is provided by the patient.  Dental Pain  Location:  Upper Upper teeth location:  12/LU 1st bicuspid Quality:  Aching Severity:  Mild Onset quality:  Gradual Duration:  2 weeks Timing:  Constant Chronicity:  New Context: dental caries   Context: not abscess and not crown fracture   Previous work-up:  Dental exam Relieved by:  Nothing Worsened by:  Nothing Ineffective treatments:  NSAIDs and topical anesthetic gel Associated symptoms: gum swelling   Associated symptoms: no congestion, no difficulty swallowing, no drooling, no facial pain, no facial swelling and no fever   Risk factors: no alcohol problem, no cancer and no chewing tobacco use     Past Medical History:  Diagnosis Date  . Polysubstance abuse [facility])     Patient Active Problem List   Diagnosis Date Noted  . 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 Perforated duodenal ulcer (HCC) 01/22/2015    Past Surgical History:  Procedure Laterality Date  . ABDOMINAL SURGERY    . LAPAROTOMY N/A 01/22/2015   Procedure: EXPLORATORY LAPAROTOMY,  GRAHAM PATCH CLOSURE OF PERFORATED DUODENAL ULCER;  Surgeon: Darnell Level, MD;  Location: [facility];  Service: General;  Laterality: N/A;       Home Medications    Prior to Admission medications   Medication Sig Start Date End Date Taking? Authorizing Provider  acetaminophen (TYLENOL) 325 MG tablet Take 2 tablets (650 mg total) by mouth every 6 (six) hours as needed. Patient not taking: Reported on 03/15/2016 01/28/15    Sherrie George, PA-C  amoxicillin-clavulanate (AUGMENTIN) 875-125 MG per tablet Take 1 tablet by mouth every 12 (twelve) hours. Patient not taking: Reported on 03/15/2016 01/28/15   Sherrie George, PA-C  cyclobenzaprine (FLEXERIL) 10 MG tablet Take 1 tablet (10 mg total) by mouth 3 (three) times daily as needed for muscle spasms. 02/24/17   Street, [place], PA-C  HYDROcodone-acetaminophen (NORCO/VICODIN) 5-325 MG tablet Take 1 tablet by mouth every 4 (four) hours as needed for up to 3 days. 01/14/18 01/17/18  Malli Falotico C, PA-C  ibuprofen (ADVIL,MOTRIN) 600 MG tablet Take 1 tablet (600 mg total) by mouth every 6 (six) hours as needed. 09/01/16   Audry Pili, PA-C  methocarbamol (ROBAXIN) 500 MG tablet Take 1 tablet (500 mg total) by mouth 2 (two) times daily. 09/01/16   Audry Pili, PA-C  pantoprazole (PROTONIX) 40 MG tablet Take 1 tablet (40 mg total) by mouth daily. 03/15/16   Pisciotta, Joni Reining, PA-C  penicillin v potassium (VEETID) 500 MG tablet Take 1 tablet (500 mg total) by mouth 3 (three) times daily. 01/14/18   Loella Hickle C, PA-C  sucralfate (CARAFATE) 1 GM/10ML suspension Take 10 mLs (1 g total) by mouth 4 (four) times daily -  with meals and at bedtime. 03/15/16   Pisciotta, Mardella Layman    Family History History reviewed. No pertinent family history.  Social History Social History   Tobacco Use  . Smoking status: Current Every Day Smoker  . Smokeless tobacco: Never Used  Substance  Use Topics  . Alcohol use: Yes  . Drug use: Yes    Types: Cocaine, Marijuana     Allergies   Patient has no known allergies.   Review of Systems Review of Systems  Constitutional: Negative for chills and fever.  HENT: Positive for dental problem. Negative for congestion, drooling, ear pain, facial swelling and sore throat.   Eyes: Negative for pain and visual disturbance.  Respiratory: Negative for cough and shortness of breath.   Cardiovascular: Negative for chest pain and palpitations.    Gastrointestinal: Negative for abdominal pain and vomiting.  Genitourinary: Negative for dysuria and hematuria.  Musculoskeletal: Negative for arthralgias and back pain.  Skin: Negative for color change and rash.  Neurological: Negative for seizures and syncope.  All other systems reviewed and are negative.    Physical Exam Triage Vital Signs ED Triage Vitals [01/14/18 1947]  Enc Vitals Group     BP (!) 150/93     Pulse Rate 82     Resp 18     Temp (!) 100.5 F (38.1 C)     Temp Source Oral     SpO2 97 %     Weight      Height      Head Circumference      Peak Flow      Pain Score      Pain Loc      Pain Edu?      Excl. in GC?    No data found.  Updated Vital Signs BP (!) 150/93 (BP Location: Right Arm)   Pulse 82   Temp (!) 100.5 F (38.1 C) (Oral)   Resp 18   SpO2 97%   Visual Acuity Right Eye Distance:   Left Eye Distance:   Bilateral Distance:    Right Eye Near:   Left Eye Near:    Bilateral Near:     Physical Exam  Constitutional: He appears well-developed and well-nourished.  HENT:  Head: Normocephalic and atraumatic.  Right Ear: Hearing, tympanic membrane, external ear and ear canal normal.  Left Ear: Hearing, tympanic membrane, external ear and ear canal normal.  Nose: Nose normal.  Mouth/Throat: Uvula is midline and oropharynx is clear and moist. Dental caries present. No dental abscesses. No oropharyngeal exudate, posterior oropharyngeal edema, posterior oropharyngeal erythema or tonsillar abscesses.    Eyes: Conjunctivae are normal.  Neck: Neck supple.  Cardiovascular: Normal rate and regular rhythm.  No murmur heard. Pulmonary/Chest: Effort normal and breath sounds normal. No stridor. No respiratory distress. He has no decreased breath sounds. He has no wheezes. He has no rhonchi. He has no rales.  Abdominal: Soft. There is no tenderness.  Musculoskeletal: He exhibits no edema.  Neurological: He is alert.  Skin: Skin is warm and dry.   Psychiatric: He has a normal mood and affect.  Nursing note and vitals reviewed.    UC Treatments / Results  Labs (all labs ordered are listed, but only abnormal results are displayed) Labs Reviewed - No data to display  EKG None Radiology No results found.  Procedures Procedures (including critical care time)  Medications Ordered in UC Medications - No data to display   Initial Impression / Assessment and Plan / UC Course  I have reviewed the triage vital signs and the nursing notes.  Pertinent labs & imaging results that were available during my care of the patient were reviewed by me and considered in my medical decision making (see chart for details).  Dental caries without evidence of dental abscess.  Final Clinical Impressions(s) / UC Diagnoses   Final diagnoses:  Pain due to dental caries    ED Discharge Orders        Ordered    penicillin v potassium (VEETID) 500 MG tablet  3 times daily     01/14/18 1954    HYDROcodone-acetaminophen (NORCO/VICODIN) 5-325 MG tablet  Every 4 hours PRN     01/14/18 1954       Controlled Substance Prescriptions [place] Controlled Substance Registry consulted? Yes, I have consulted the [place] Controlled Substances Registry for this patient, and feel the risk/benefit ratio today is favorable for proceeding with this prescription for a controlled substance.   Alecia LemmingBlue, Suvan Stcyr C, [place]-C 01/14/18 2019

## 2018-01-14 NOTE — ED Triage Notes (Signed)
Pt here for dental pain to left side of face with swelling

## 2018-10-05 ENCOUNTER — Encounter (HOSPITAL_COMMUNITY): Payer: Self-pay

## 2018-10-05 ENCOUNTER — Emergency Department (HOSPITAL_COMMUNITY)
Admission: EM | Admit: 2018-10-05 | Discharge: 2018-10-06 | Disposition: A | Discharge status: Home / Self Care | Payer: Self-pay | Attending: Emergency Medicine | Admitting: Emergency Medicine

## 2018-10-05 ENCOUNTER — Other Ambulatory Visit: Payer: Self-pay

## 2018-10-05 DIAGNOSIS — M272 Inflammatory conditions of jaws: Secondary | ICD-10-CM | POA: Insufficient documentation

## 2018-10-05 LAB — CBC
HCT: 46.2 % (ref 39.0–52.0)
Hemoglobin: 15.1 g/dL (ref 13.0–17.0)
MCH: 29 pg (ref 26.0–34.0)
MCHC: 32.7 g/dL (ref 30.0–36.0)
MCV: 88.7 fL (ref 80.0–100.0)
PLATELETS: 298 10*3/uL (ref 150–400)
RBC: 5.21 MIL/uL (ref 4.22–5.81)
RDW: 13.1 % (ref 11.5–15.5)
WBC: 14 10*3/uL — ABNORMAL HIGH (ref 4.0–10.5)
nRBC: 0 % (ref 0.0–0.2)

## 2018-10-05 LAB — BASIC METABOLIC PANEL
Anion gap: 6 (ref 5–15)
BUN: 8 mg/dL (ref 6–20)
CALCIUM: 9.7 mg/dL (ref 8.9–10.3)
CO2: 30 mmol/L (ref 22–32)
CREATININE: 1.21 mg/dL (ref 0.61–1.24)
Chloride: 103 mmol/L (ref 98–111)
GFR calc Af Amer: >60 mL/min (ref >60)
GFR calc non Af Amer: >60 mL/min (ref >60)
GLUCOSE: 89 mg/dL (ref 70–99)
Potassium: 3.9 mmol/L (ref 3.5–5.1)
Sodium: 139 mmol/L (ref 135–145)

## 2018-10-05 NOTE — ED Triage Notes (Signed)
Pt here for an abscess to the roof of his mouth.  Pt said swelling started yesterday and became more painful this morning.  A&Ox4 still talking in complete sentences.

## 2018-10-06 MED ORDER — AMOXICILLIN 500 MG PO CAPS
1000.0000 mg | ORAL_CAPSULE | Freq: Once | ORAL | Status: AC
  Administered 2018-10-06: 1000 mg via ORAL
  Filled 2018-10-06: qty 2

## 2018-10-06 MED ORDER — LIDOCAINE-EPINEPHRINE (PF) 2 %-1:200000 IJ SOLN
5.0000 mL | Freq: Once | INTRAMUSCULAR | Status: AC
  Administered 2018-10-06: 5 mL
  Filled 2018-10-06: qty 20

## 2018-10-06 MED ORDER — AMOXICILLIN 500 MG PO CAPS: 1000.0000 mg | ORAL_CAPSULE | Freq: Two times a day (BID) | ORAL | 0 refills | Status: DC

## 2018-10-06 NOTE — ED Notes (Signed)
Pt presented to nurse first stating that his abscess had ruptured and was now "leaking" in his mouth. Pt was inquiring about whether or not discharge was toxic to his system if swallowed. Pt informed that while it is recommended he spit it out instead of swallowing, it is not toxic to the body.   Arlys JohnBrian, triage RN informed of pt status.

## 2018-10-06 NOTE — Discharge Instructions (Addendum)
Swish warm salt water and spit it out several times a day.  If abscess continues to bother you, then follow up with the ENT physician.
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
## 2018-10-06 NOTE — ED Provider Notes (Signed)
[facility] Provider Note   CSN: [id number] Arrival date & time: 10/05/18  2023     History   Chief Complaint Chief Complaint  Patient presents with  . Abscess    HPI Victor Monroe is a 32 y.o. male.  The history is provided by the patient.  He has a history of substance abuse, perforated ulcer and comes in because of an abscess on the hard palate on the left side.  He noticed a lump there today.  He thought he had just burned the roof of his mouth, but it started draining when he was in the triage area.  He denies fever or chills.  Past Medical History:  Diagnosis Date  . Polysubstance abuse [facility](HCC)     Patient Active Problem List   Diagnosis Date Noted  . 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 Perforated duodenal ulcer (HCC) 01/22/2015    Past Surgical History:  Procedure Laterality Date  . ABDOMINAL SURGERY    . LAPAROTOMY N/A 01/22/2015   Procedure: EXPLORATORY LAPAROTOMY,  GRAHAM PATCH CLOSURE OF PERFORATED DUODENAL ULCER;  Surgeon: Darnell Levelodd Gerkin, MD;  Location: [facility];  Service: General;  Laterality: N/A;        Home Medications    Prior to Admission medications   Medication Sig Start Date End Date Taking? Authorizing Provider  acetaminophen (TYLENOL) 325 MG tablet Take 2 tablets (650 mg total) by mouth every 6 (six) hours as needed. Patient not taking: Reported on 03/15/2016 01/28/15   Sherrie GeorgeJennings, Willard, PA-C  amoxicillin-clavulanate (AUGMENTIN) 875-125 MG per tablet Take 1 tablet by mouth every 12 (twelve) hours. Patient not taking: Reported on 03/15/2016 01/28/15   Sherrie GeorgeJennings, Willard, PA-C  cyclobenzaprine (FLEXERIL) 10 MG tablet Take 1 tablet (10 mg total) by mouth 3 (three) times daily as needed for muscle spasms. 02/24/17   Street, Russells PointMercedes, PA-C  ibuprofen (ADVIL,MOTRIN) 600 MG tablet Take 1 tablet (600 mg total) by mouth every 6 (six) hours as needed. 09/01/16   Audry PiliMohr, Tyler, PA-C  methocarbamol (ROBAXIN) 500 MG tablet Take 1  tablet (500 mg total) by mouth 2 (two) times daily. 09/01/16   Audry PiliMohr, Tyler, PA-C  pantoprazole (PROTONIX) 40 MG tablet Take 1 tablet (40 mg total) by mouth daily. 03/15/16   Pisciotta, Joni ReiningNicole, PA-C  penicillin v potassium (VEETID) 500 MG tablet Take 1 tablet (500 mg total) by mouth 3 (three) times daily. 01/14/18   Blue, Olivia C, PA-C  sucralfate (CARAFATE) 1 GM/10ML suspension Take 10 mLs (1 g total) by mouth 4 (four) times daily -  with meals and at bedtime. 03/15/16   Pisciotta, Mardella LaymanNicole, PA-C    Family History History reviewed. No pertinent family history.  Social History Social History   Tobacco Use  . Smoking status: Current Every Day Smoker  . Smokeless tobacco: Never Used  Substance Use Topics  . Alcohol use: Yes  . Drug use: Yes    Types: Cocaine, Marijuana     Allergies   Patient has no known allergies.   Review of Systems Review of Systems  All other systems reviewed and are negative.    Physical Exam Updated Vital Signs BP (!) 136/100 (BP Location: Left Wrist)   Pulse 70   Temp 98.6 F (37 C) (Oral)   Resp 18   SpO2 98%   Physical Exam Vitals signs and nursing note reviewed.    32 year old male, resting comfortably and in no acute distress. Vital signs are significant for elevated blood pressure. Oxygen saturation  is 98%, which is normal. Head is normocephalic and atraumatic. PERRLA, EOMI. Oral cavity shows an abscess on the left side of the hard palate. Neck is nontender and supple without adenopathy or JVD. Back is nontender and there is no CVA tenderness. Lungs are clear without rales, wheezes, or rhonchi. Chest is nontender. Heart has regular rate and rhythm without murmur. Abdomen is soft, flat, nontender without masses or hepatosplenomegaly and peristalsis is normoactive. Extremities have no cyanosis or edema, full range of motion is present. Skin is warm and dry without rash. Neurologic: Mental status is normal, cranial nerves are intact, there are  no motor or sensory deficits.  ED Treatments / Results  Labs (all labs ordered are listed, but only abnormal results are displayed) Labs Reviewed  CBC - Abnormal; Notable for the following components:      Result Value   WBC 14.0 (*)    All other components within normal limits  BASIC METABOLIC PANEL   Procedures .Marland Kitchen.Incision and Drainage Date/Time: 10/06/2018 2:33 AM Performed by: Dione BoozeGlick, Emmelina Mcloughlin, MD Authorized by: Dione BoozeGlick, Odas Ozer, MD   Consent:    Consent obtained:  Verbal   Consent given by:  Patient   Risks discussed:  Bleeding, incomplete drainage and pain   Alternatives discussed:  No treatment Location:    Type:  Abscess   Location:  Head   Head/neck location: hard palate. Procedure type:    Complexity:  Simple Procedure details:    Needle aspiration: yes     Needle size:  18 G   Drainage:  Purulent   Drainage amount:  Scant   Packing materials:  None Post-procedure details:    Patient tolerance of procedure:  Tolerated well, no immediate complications     Medications Ordered in ED Medications  amoxicillin (AMOXIL) capsule 1,000 mg (has no administration in time range)  lidocaine-EPINEPHrine (XYLOCAINE W/EPI) 2 %-1:200000 (PF) injection 5 mL (5 mLs Infiltration Given 10/06/18 0206)     Initial Impression / Assessment and Plan / ED Course  I have reviewed the triage vital signs and the nursing notes.  Pertinent lab results that were available during my care of the patient were reviewed by me and considered in my medical decision making (see chart for details).  Abscess of the hard palate.  Screening labs showed mild leukocytosis.  Abscess is aspirated under local anesthesia, he is discharged with prescription for amoxicillin.  Final Clinical Impressions(s) / ED Diagnoses   Final diagnoses:  Hard palate abscess    ED Discharge Orders    None       Dione BoozeGlick, Shawnese Magner, MD 10/06/18 [phone]

## 2018-11-09 ENCOUNTER — Other Ambulatory Visit: Payer: Self-pay

## 2018-11-09 ENCOUNTER — Ambulatory Visit (HOSPITAL_COMMUNITY)
Admission: EM | Admit: 2018-11-09 | Discharge: 2018-11-09 | Disposition: A | Discharge status: Home / Self Care | Payer: Self-pay | Attending: Family Medicine | Admitting: Family Medicine

## 2018-11-09 ENCOUNTER — Encounter (HOSPITAL_COMMUNITY): Payer: Self-pay | Admitting: Emergency Medicine

## 2018-11-09 DIAGNOSIS — M7661 Achilles tendinitis, right leg: Secondary | ICD-10-CM | POA: Insufficient documentation

## 2018-11-09 MED ORDER — METHYLPREDNISOLONE 4 MG PO TBPK: ORAL_TABLET | ORAL | 0 refills | Status: DC

## 2018-11-09 MED ORDER — IBUPROFEN 800 MG PO TABS: 800.0000 mg | ORAL_TABLET | Freq: Three times a day (TID) | ORAL | 0 refills | Status: DC

## 2018-11-09 NOTE — ED Triage Notes (Signed)
Left ankle swelling for 2 days.  Painful to take a step.  No known injury.  Pain in back of ankle, particularly with flexion of left foot

## 2018-11-09 NOTE — Discharge Instructions (Signed)
Limit walking for the next few days Ice for 20 min every 2-3 hours Gently stretching of the foot and ankle Take the medrol pak as instructed Take all of day one today After the medrol switch to ibuprofen Get a heel pad for your shoe Need a more supportive shoe Return if you fail to see improvement in a couple of weeks

## 2018-11-09 NOTE — ED Provider Notes (Signed)
[facility]    CSN: [id number] Arrival date & time: 11/09/18  1444     History   Chief Complaint Chief Complaint  Patient presents with  . Ankle Pain    HPI Victor Monroe is a 33 y.o. male.   HPI  Here for ankle pain Gradual development for 2 + weeks.  Last 2 days is severe and he is limping.  Sent here from work and told not to come back until better.  No injury.  Walks "everywhere.  Has no vehicle.  Takes bus to work.  Works in [employer] and on feet all day long. No slip, trip, fall or injury.  Denies prior ankle pain.  Past Medical History:  Diagnosis Date  . Polysubstance abuse [facility])     Patient Active Problem List   Diagnosis Date Noted  . 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 Perforated duodenal ulcer (HCC) 01/22/2015    Past Surgical History:  Procedure Laterality Date  . ABDOMINAL SURGERY    . LAPAROTOMY N/A 01/22/2015   Procedure: EXPLORATORY LAPAROTOMY,  GRAHAM PATCH CLOSURE OF PERFORATED DUODENAL ULCER;  Surgeon: Darnell Level, MD;  Location: [facility];  Service: General;  Laterality: N/A;       Home Medications    Prior to Admission medications   Medication Sig Start Date End Date Taking? Authorizing Provider  ibuprofen (ADVIL,MOTRIN) 800 MG tablet Take 1 tablet (800 mg total) by mouth 3 (three) times daily. 11/09/18   Eustace Moore, MD  methylPREDNISolone (MEDROL DOSEPAK) 4 MG TBPK tablet tad 11/09/18   Eustace Moore, MD    Family History Family History  Problem Relation Age of Onset  . Supraventricular tachycardia Mother     Social History Social History   Tobacco Use  . Smoking status: Current Every Day Smoker  . Smokeless tobacco: Never Used  Substance Use Topics  . Alcohol use: Yes  . Drug use: Yes    Types: Cocaine, Marijuana     Allergies   Patient has no known allergies.   Review of Systems Review of Systems  Constitutional: Negative for chills and fever.  HENT: Negative for ear pain and sore throat.     Eyes: Negative for pain and visual disturbance.  Respiratory: Negative for cough and shortness of breath.   Cardiovascular: Negative for chest pain and palpitations.  Gastrointestinal: Negative for abdominal pain and vomiting.  Genitourinary: Negative for dysuria and hematuria.  Musculoskeletal: Positive for arthralgias and gait problem. Negative for back pain.  Skin: Negative for color change and rash.  Neurological: Negative for seizures and syncope.  All other systems reviewed and are negative.    Physical Exam Triage Vital Signs ED Triage Vitals  Enc Vitals Group     BP 11/09/18 1534 126/69     Pulse Rate 11/09/18 1534 64     Resp 11/09/18 1534 20     Temp 11/09/18 1534 98.8 F (37.1 C)     Temp Source 11/09/18 1534 Temporal     SpO2 11/09/18 1534 100 %     Weight --      Height --      Head Circumference --      Peak Flow --      Pain Score 11/09/18 1531 8     Pain Loc --      Pain Edu? --      Excl. in GC? --    No data found.  Updated Vital Signs BP 126/69 (BP Location: Right Arm) Comment (BP  Location): large cuff  Pulse 64   Temp 98.8 F (37.1 C) (Temporal)   Resp 20   SpO2 100%       Physical Exam Constitutional:      General: He is not in acute distress.    Appearance: He is well-developed. He is obese.  HENT:     Head: Normocephalic and atraumatic.  Eyes:     Conjunctiva/sclera: Conjunctivae normal.     Pupils: Pupils are equal, round, and reactive to light.  Neck:     Musculoskeletal: Normal range of motion.  Cardiovascular:     Rate and Rhythm: Normal rate.  Pulmonary:     Effort: Pulmonary effort is normal. No respiratory distress.  Abdominal:     General: There is no distension.     Palpations: Abdomen is soft.  Musculoskeletal: Normal range of motion.     Comments: Antalgic gait.  Foot externally rotated.  Tender Achille tendon insertion and bursa.  Pain with dorsiflexion.  Calf nontender.  Skin:    General: Skin is warm and dry.   Neurological:     Mental Status: He is alert.      UC Treatments / Results  Labs (all labs ordered are listed, but only abnormal results are displayed) Labs Reviewed - No data to display  EKG None  Radiology No results found.  Procedures Procedures (including critical care time)  Medications Ordered in UC Medications - No data to display  Initial Impression / Assessment and Plan / UC Course  I have reviewed the triage vital signs and the nursing notes.  Pertinent labs & imaging results that were available during my care of the patient were reviewed by me and considered in my medical decision making (see chart for details).     Discussed achilles tendinitis , treatement.   Final Clinical Impressions(s) / UC Diagnoses   Final diagnoses:  Achilles tendinitis, right leg     Discharge Instructions     Limit walking for the next few days Ice for 20 min every 2-3 hours Gently stretching of the foot and ankle Take the medrol pak as instructed Take all of day one today After the medrol switch to ibuprofen Get a heel pad for your shoe Need a more supportive shoe Return if you fail to see improvement in a couple of weeks   ED Prescriptions    Medication Sig Dispense Auth. Provider   methylPREDNISolone (MEDROL DOSEPAK) 4 MG TBPK tablet tad 21 tablet Eustace MooreNelson, Acacia Latorre Sue, MD   ibuprofen (ADVIL,MOTRIN) 800 MG tablet Take 1 tablet (800 mg total) by mouth 3 (three) times daily. 21 tablet Eustace MooreNelson, Gredmarie Delange Sue, MD     Controlled Substance Prescriptions [place] Controlled Substance Registry consulted? Not Applicable   Eustace MooreNelson, Malaia Buchta Sue, MD 11/09/18 1620

## 2019-03-18 ENCOUNTER — Emergency Department (HOSPITAL_COMMUNITY): Payer: Self-pay

## 2019-03-18 ENCOUNTER — Other Ambulatory Visit: Payer: Self-pay

## 2019-03-18 ENCOUNTER — Encounter (HOSPITAL_COMMUNITY): Payer: Self-pay

## 2019-03-18 ENCOUNTER — Emergency Department (HOSPITAL_COMMUNITY)
Admission: EM | Admit: 2019-03-18 | Discharge: 2019-03-19 | Disposition: A | Discharge status: Home / Self Care | Payer: Self-pay | Attending: Emergency Medicine | Admitting: Emergency Medicine

## 2019-03-18 DIAGNOSIS — F17200 Nicotine dependence, unspecified, uncomplicated: Secondary | ICD-10-CM | POA: Insufficient documentation

## 2019-03-18 DIAGNOSIS — R109 Unspecified abdominal pain: Secondary | ICD-10-CM | POA: Insufficient documentation

## 2019-03-18 LAB — BASIC METABOLIC PANEL
Anion gap: 12 (ref 5–15)
BUN: 13 mg/dL (ref 6–20)
CO2: 21 mmol/L — ABNORMAL LOW (ref 22–32)
Calcium: 9.4 mg/dL (ref 8.9–10.3)
Chloride: 105 mmol/L (ref 98–111)
Creatinine, Ser: 1.07 mg/dL (ref 0.61–1.24)
GFR calc Af Amer: >60 mL/min (ref >60)
GFR calc non Af Amer: >60 mL/min (ref >60)
Glucose, Bld: 99 mg/dL (ref 70–99)
Potassium: 3.6 mmol/L (ref 3.5–5.1)
Sodium: 138 mmol/L (ref 135–145)

## 2019-03-18 LAB — URINALYSIS, ROUTINE W REFLEX MICROSCOPIC
Bacteria, UA: NONE SEEN
Bilirubin Urine: NEGATIVE
Glucose, UA: NEGATIVE mg/dL
Ketones, ur: NEGATIVE mg/dL
Leukocytes,Ua: NEGATIVE
Nitrite: NEGATIVE
Protein, ur: 30 mg/dL — AB
Specific Gravity, Urine: 1.027 (ref 1.005–1.030)
pH: 5 (ref 5.0–8.0)

## 2019-03-18 LAB — CBC
HCT: 47.1 % (ref 39.0–52.0)
Hemoglobin: 15.8 g/dL (ref 13.0–17.0)
MCH: 29.6 pg (ref 26.0–34.0)
MCHC: 33.5 g/dL (ref 30.0–36.0)
MCV: 88.4 fL (ref 80.0–100.0)
Platelets: 291 10*3/uL (ref 150–400)
RBC: 5.33 MIL/uL (ref 4.22–5.81)
RDW: 13.8 % (ref 11.5–15.5)
WBC: 15.8 10*3/uL — ABNORMAL HIGH (ref 4.0–10.5)
nRBC: 0 % (ref 0.0–0.2)

## 2019-03-18 NOTE — ED Provider Notes (Signed)
[facility] Provider Note   CSN: [id number] Arrival date & time: 03/18/19  1911  History   Chief Complaint Chief Complaint  Patient presents with   Flank Pain   Back Pain    HPI Victor Monroe is a 33 y.o. male with past medical history significant for polysubstance abuse, ruptured duodenal ulcer (2015) who presents for evaluation of right-sided flank pain.  Patient states he has had right-sided flank pain x4-5 days.  States pain is intermittent in nature.  Denies history of prior kidney stones.  Patient is concerned as he was told not to drink alcohol after his ruptured ulcer in 2015 and states that he drank alcohol on May 31 for his friend's birthday and he is concerned for additional ulcers.  He has not taken anything for his pain.  He rates his current pain a 5/10.  Denies fever, chills, nausea, vomiting, chest pain, shortness of breath, abdominal pain, diarrhea, dysuria, testicular pain, penile discharge, hematuria.  Symptoms intermittent in nature.  Denies additional aggravating or alleviating factors.  Denies recent injuries or trauma.  Pain is not worse with food intake. Denies IVDU, bowel or bladder incontinence, saddle paresthesias, hx of chronic steroids, malignancy.  History obtained from patient.  No interpreter was used.     HPI  Past Medical History:  Diagnosis Date   Polysubstance abuse [facility](HCC)     Patient Active Problem List   Diagnosis Date Noted   Perforated viscus 01/22/2015   Perforated duodenal ulcer (HCC) 01/22/2015    Past Surgical History:  Procedure Laterality Date   ABDOMINAL SURGERY     LAPAROTOMY N/A 01/22/2015   Procedure: EXPLORATORY LAPAROTOMY,  GRAHAM PATCH CLOSURE OF PERFORATED DUODENAL ULCER;  Surgeon: Darnell Levelodd Gerkin, MD;  Location: [facility];  Service: General;  Laterality: N/A;        Home Medications    Prior to Admission medications   Medication Sig Start Date End Date Taking? Authorizing  Provider  amoxicillin (AMOXIL) 500 MG capsule Take 500 mg by mouth as needed (abscess in mouth). Told to take if abscess came back   Yes [provider]  ibuprofen (ADVIL) 200 MG tablet Take 600 mg by mouth every 6 (six) hours as needed for moderate pain (back pain).   Yes [provider]  ibuprofen (ADVIL,MOTRIN) 800 MG tablet Take 1 tablet (800 mg total) by mouth 3 (three) times daily. Patient not taking: Reported on 03/18/2019 11/09/18   Eustace MooreNelson, Yvonne Sue, MD  methylPREDNISolone (MEDROL DOSEPAK) 4 MG TBPK tablet tad Patient not taking: Reported on 03/18/2019 11/09/18   Eustace MooreNelson, Yvonne Sue, MD    Family History Family History  Problem Relation Age of Onset   Supraventricular tachycardia Mother     Social History Social History   Tobacco Use   Smoking status: Current Every Day Smoker   Smokeless tobacco: Never Used  Substance Use Topics   Alcohol use: Yes   Drug use: Yes    Types: Cocaine, Marijuana    Allergies   Patient has no known allergies.   Review of Systems Review of Systems  HENT: Negative.   Respiratory: Negative.   Cardiovascular: Negative.   Gastrointestinal: Negative.   Genitourinary: Positive for flank pain. Negative for decreased urine volume, difficulty urinating, discharge, dysuria, frequency, genital sores, hematuria, penile pain, penile swelling, scrotal swelling, testicular pain and urgency.  Skin: Negative.   Neurological: Negative.   All other systems reviewed and are negative.  Physical Exam Updated Vital Signs BP 138/78 (  BP Location: Right Arm)    Pulse 76    Temp 98.6 F (37 C) (Oral)    Resp 16    SpO2 100%   Physical Exam Vitals signs and nursing note reviewed. Exam conducted with a chaperone present.  Constitutional:      General: He is not in acute distress.    Appearance: He is well-developed. He is not ill-appearing, toxic-appearing or diaphoretic.  HENT:     Head: Normocephalic and atraumatic.     Nose: Nose  normal.     Mouth/Throat:     Mouth: Mucous membranes are moist.     Pharynx: Oropharynx is clear.  Eyes:     Pupils: Pupils are equal, round, and reactive to light.  Neck:     Musculoskeletal: Normal range of motion and neck supple.     Comments: No neck stiffness or neck rigidity. Cardiovascular:     Rate and Rhythm: Normal rate and regular rhythm.     Pulses: Normal pulses.     Heart sounds: Normal heart sounds. No murmur. No friction rub. No gallop.   Pulmonary:     Effort: Pulmonary effort is normal. No respiratory distress.     Comments: Clear to auscultation bilaterally that wheeze, rhonchi rales.  No accessory muscle usage.  Speaks in full sentences with difficulty. Abdominal:     General: There is no distension.     Palpations: Abdomen is soft.     Hernia: There is no hernia in the right inguinal area or left inguinal area.     Comments: Soft, nontender without rebound or guarding.  Mild CVA tenderness to right flank, pain reproducible to palpation. No abdominal wall/ flank skin changes.  Normoactive bowel sounds.  Negative Murphy sign.  Genitourinary:    Scrotum/Testes: Normal.     Epididymis:     Right: Normal.     Left: Normal.  Musculoskeletal: Normal range of motion.  Lymphadenopathy:     Lower Body: No right inguinal adenopathy. No left inguinal adenopathy.  Skin:    General: Skin is warm and dry.     Comments: No rashes or lesions.  Neurological:     Mental Status: He is alert.    ED Treatments / Results  Labs (all labs ordered are listed, but only abnormal results are displayed) Labs Reviewed  URINALYSIS, ROUTINE W REFLEX MICROSCOPIC - Abnormal; Notable for the following components:      Result Value   Hgb urine dipstick SMALL (*)    Protein, ur 30 (*)    All other components within normal limits  BASIC METABOLIC PANEL - Abnormal; Notable for the following components:   CO2 21 (*)    All other components within normal limits  CBC - Abnormal; Notable  for the following components:   WBC 15.8 (*)    All other components within normal limits    EKG None  Radiology Dg Chest 2 View  Result Date: 03/18/2019 CLINICAL DATA:  Initial evaluation for acute chest pain. EXAM: CHEST - 2 VIEW COMPARISON:  Prior radiograph from 01/22/2015. FINDINGS: The cardiac and mediastinal silhouettes are stable in size and contour, and remain within normal limits. The lungs are normally inflated. No airspace consolidation, pleural effusion, or pulmonary edema is identified. There is no pneumothorax. No acute osseous abnormality identified. IMPRESSION: No active cardiopulmonary disease. Electronically Signed   By: Rise MuBenjamin  McClintock M.D.   On: 03/18/2019 23:59   Ct Renal Stone Study  Result Date: 03/18/2019 CLINICAL DATA:  33 year old  male with right side flank and back pain for 5 days. History of repaired duodenal ulcer perforation in 2016. EXAM: CT ABDOMEN AND PELVIS WITHOUT CONTRAST TECHNIQUE: Multidetector CT imaging of the abdomen and pelvis was performed following the standard protocol without IV contrast. COMPARISON:  Upper GI series 01/25/2015. FINDINGS: Lower chest: Negative. Hepatobiliary: Negative noncontrast liver and gallbladder. Pancreas: Negative. Spleen: Negative. Adrenals/Urinary Tract: Normal adrenal glands. Negative noncontrast kidneys. No perinephric stranding. Proximal ureters appear decompressed and normal. No periureteral stranding is evident. The right cord *SCRATCHED * at the right ureter course is well visualized and normal. The distal left ureter also appears normal. Decompressed and unremarkable urinary bladder. Stomach/Bowel: Redundant sigmoid colon tracks into the right lower quadrant and mid abdomen. Largely decompressed sigmoid. Negative rectum aside from some retained gas and stool. Decompressed descending colon. Mild retained stool in the transverse and right colon. Normal appendix on series 3, image 62. No large bowel inflammation. Negative  terminal ileum. No dilated small bowel. Negative stomach. No free air, free fluid. Vascular/Lymphatic: Mild calcified iliac artery atherosclerosis. Vascular patency is not evaluated in the absence of IV contrast. No lymphadenopathy. Reproductive: Negative. Other: No free fluid. Musculoskeletal: No acute osseous abnormality identified. IMPRESSION: Negative noncontrast CT Abdomen and Pelvis. Normal appendix.  No urinary calculus or obstructive uropathy. Electronically Signed   By: Odessa FlemingH  Hall M.D.   On: 03/18/2019 23:08    Procedures Procedures (including critical care time)  Medications Ordered in ED Medications - No data to display   Initial Impression / Assessment and Plan / ED Course  I have reviewed the triage vital signs and the nursing notes.  Pertinent labs & imaging results that were available during my care of the patient were reviewed by me and considered in my medical decision making (see chart for details).  33 year old male appears otherwise well presents for evaluation of right-sided flank pain.  He is afebrile, nonseptic, non-ill-appearing.  No history of prior stones however does have prior medical history of ruptured ulcer in 2015.  Pain intermittent in nature.  No recent injuries or trauma.  Abdomen soft without rebound or guarding.  He has some mild tenderness to his right flank.  No urinary symptoms.  GU exam without abnormality.  Heart and lungs clear.  Labs obtained from triage.  Patient does not want anything for pain at this time.  Negative Murphy sign.  Able to reproduce pain to palpation to right flank.  There are no over lying skin changes to indicate Zoster.  Labs and imaging personally reviewed Metabolic panel without electrolyte, renal or liver abnormality CBC with mild leukocytosis at 15.8 Urinalysis with small blood.  Neagtive for infection CT stone protocol negative Chest without evidence of free air under diaphragm, infiltrates, cardiomegaly or pulmonary  edema  Patient is nontoxic, nonseptic appearing, in no apparent distress.  Patient's pain and other symptoms adequately managed in emergency department.  Fluid bolus given.  Labs, imaging and vitals reviewed.  Patient does not meet the SIRS or Sepsis criteria.  On repeat exam patient does not have a surgical abdomin and there are no peritoneal signs.  No indication of appendicitis, bowel obstruction, bowel perforation, cholecystitis, diverticulitis.  Pain able to be reproduced to palpation.  Likely musculoskeletal strain.  CT scan does not show any evidence of current stone however it is possible he did recently pass a stone.  I have low suspicion for recurrent perforated ulcer as chest x-ray without evidence of free air CT abdomen negative.  Low suspicion  for gallbladder pathology or pancreatitis given exam and history. Tolerating PO intake in ED without difficulty. Will have patient follow up with PCP for reevaluation. No red flags for back pain. Low suspicion for cauda equina, discitis, osteomyelitis, transverse myelitis, psoas abscess.  The patient has been appropriately medically screened and/or stabilized in the ED. I have low suspicion for any other emergent medical condition which would require further screening, evaluation or treatment in the ED or require inpatient management.  Patient is hemodynamically stable and in no acute distress.  Patient able to ambulate in department prior to ED.  Evaluation does not show acute pathology that would require ongoing or additional emergent interventions while in the emergency department or further inpatient treatment.  I have discussed the diagnosis with the patient and answered all questions.  Pain is been managed while in the emergency department and patient has no further complaints prior to discharge.  Patient is comfortable with plan discussed in room and is stable for discharge at this time.  I have discussed strict return precautions for returning to the  emergency department.  Patient was encouraged to follow-up with PCP/specialist refer to at discharge.     Final Clinical Impressions(s) / ED Diagnoses   Final diagnoses:  Right flank pain    ED Discharge Orders    None       Zsofia Prout A, PA-C 03/19/19 0125    Quintella Reichert, MD 03/22/19 [phone]

## 2019-03-18 NOTE — ED Notes (Signed)
Patient transported to CT 

## 2019-03-18 NOTE — ED Triage Notes (Signed)
Pt reports right sided flank pain for the past 4 days that radiates to his lower pain. Hx of an ulcer rupture that needed surgery around 2015. Pt denies any n/v. Pt a.o, nad noted.

## 2019-03-18 NOTE — ED Notes (Signed)
Pt tolerating PO fluids

## 2019-03-19 NOTE — ED Notes (Signed)
Patient verbalizes understanding of discharge instructions. Opportunity for questioning and answers were provided. Armband removed by staff, pt discharged from ED ambulatory.   

## 2019-03-19 NOTE — Discharge Instructions (Signed)
Evaluated today for flank pain.  Imaging and labs were normal.  I would suggest Tylenol and heat.  I have written you out of work for the next 2 days.  Return to the ED for any new worsening symptoms.

## 2019-03-22 ENCOUNTER — Ambulatory Visit (HOSPITAL_COMMUNITY)
Admission: EM | Admit: 2019-03-22 | Discharge: 2019-03-22 | Disposition: A | Discharge status: Home / Self Care | Payer: Self-pay | Attending: Family Medicine | Admitting: Family Medicine

## 2019-03-22 ENCOUNTER — Other Ambulatory Visit: Payer: Self-pay

## 2019-03-22 ENCOUNTER — Encounter (HOSPITAL_COMMUNITY): Payer: Self-pay

## 2019-03-22 DIAGNOSIS — R030 Elevated blood-pressure reading, without diagnosis of hypertension: Secondary | ICD-10-CM | POA: Insufficient documentation

## 2019-03-22 DIAGNOSIS — N10 Acute tubulo-interstitial nephritis: Secondary | ICD-10-CM | POA: Insufficient documentation

## 2019-03-22 DIAGNOSIS — R109 Unspecified abdominal pain: Secondary | ICD-10-CM | POA: Insufficient documentation

## 2019-03-22 LAB — POCT URINALYSIS DIP (DEVICE)
Bilirubin Urine: NEGATIVE
Glucose, UA: NEGATIVE mg/dL
Ketones, ur: NEGATIVE mg/dL
Nitrite: POSITIVE — AB
Protein, ur: 30 mg/dL — AB
Specific Gravity, Urine: 1.02 (ref 1.005–1.030)
Urobilinogen, UA: 0.2 mg/dL (ref 0.0–1.0)
pH: 6.5 (ref 5.0–8.0)

## 2019-03-22 MED ORDER — KETOROLAC TROMETHAMINE 60 MG/2ML IM SOLN
60.0000 mg | Freq: Once | INTRAMUSCULAR | Status: AC
  Administered 2019-03-22: 60 mg via INTRAMUSCULAR

## 2019-03-22 MED ORDER — KETOROLAC TROMETHAMINE 60 MG/2ML IM SOLN
INTRAMUSCULAR | Status: AC
  Filled 2019-03-22: qty 2

## 2019-03-22 MED ORDER — SULFAMETHOXAZOLE-TRIMETHOPRIM 800-160 MG PO TABS: 1.0000 | ORAL_TABLET | Freq: Two times a day (BID) | ORAL | 0 refills | Status: DC

## 2019-03-22 NOTE — Discharge Instructions (Signed)
Your blood pressure was noted to be elevated during your visit today. You may return here within the next few days to recheck if unable to see your primary care doctor.  Continue taking ibuprofen with food for your back discomfort.

## 2019-03-22 NOTE — ED Provider Notes (Signed)
[facility]   [id number] 03/22/19 Arrival Time: 1428  ASSESSMENT & PLAN:  1. Right flank pain   2. Acute pyelonephritis   3. Elevated blood pressure reading without diagnosis of hypertension    Benign exam. No indications for further imaging at this time. Discussed.  U/A nitrite + with large leukocytes. Will treat for pyelonephritis.  Meds ordered this encounter  Medications   ketorolac (TORADOL) injection 60 mg     Discharge Instructions     Your blood pressure was noted to be elevated during your visit today. You may return here within the next few days to recheck if unable to see your primary care doctor.  Continue taking ibuprofen with food for your back discomfort.    Encouraged adequate fluid intake.  Follow-up Information    [facility].   Specialty: Emergency Medicine Why: If symptoms worsen in any way. Contact information: [address] [id number] [place] [id number] [phone]       Schedule an appointment as soon as possible for a visit  [facility].   Specialty: Family Medicine Contact information: [address] [place] [phone]           Reviewed expectations re: course of current medical issues. Questions answered. Outlined signs and symptoms indicating need for more acute intervention. Patient verbalized understanding. After Visit Summary given.   SUBJECTIVE: Seen in ED on 03/18/2019; notes and workup reviewed. History from: patient. Victor Monroe is a 33 y.o. male who presents with complaint of continuing and persistent right lower flank pain. "Hasn't gone away. I just can't get comfortable. Feels like a deep ache." Questions radiation "to my butt"; right side. No specific aggravating or alleviating factors reported. No necessarily related to movement. Worse with prolonged standing. No  specific alleviating factors. No fever/chills reported. Normal PO intake without n/v. No dysuria or urinary frequency reported. No abdominal pain. No testicular pain or swelling. No penile discharge. Ambulatory without difficulty. No extremity sensation changes or weakness. Reports no h/o similar. No injury/trauma to back. OTC Tylenol with minimal and temporary help. Normal bowel movements without blood.  Negative stone study on 03/18/2019. Leukocytosis but this may be his baseline when compared to previous labs. No LMP for male patient. Past Surgical History:  Procedure Laterality Date   ABDOMINAL SURGERY     LAPAROTOMY N/A 01/22/2015   Procedure: EXPLORATORY LAPAROTOMY,  GRAHAM PATCH CLOSURE OF PERFORATED DUODENAL ULCER;  Surgeon: Darnell Levelodd Gerkin, MD;  Location: [facility];  Service: General;  Laterality: N/A;   ROS: As per HPI. All other systems negative.  OBJECTIVE:  Vitals:   03/22/19 1447 03/22/19 1451  BP: (!) 172/96   Pulse: 69   Resp: 18   Temp: 99.3 F (37.4 C)   TempSrc: Oral   SpO2: 97%   Weight:  104.3 kg    General appearance: alert, oriented, no acute distress  Oropharynx: moist Neck: supple with FROM Lungs: clear to auscultation bilaterally; unlabored respirations Heart: regular rate and rhythm Abdomen: obese; soft; without distention; no tenderness; normal bowel sounds; without masses or organomegaly; without guarding or rebound tenderness Back: with poorly localized tenderness to palpation over R flank extending toward R buttock; FROM at waist Extremities: without LE edema; symmetrical; without gross deformities Skin: warm and dry Neurologic: normal gait Psychological: alert and cooperative; normal mood and affect  Labs: Labs Reviewed  POCT URINALYSIS DIP (DEVICE) - Abnormal; Notable for the following components:  Result Value   Hgb urine dipstick TRACE (*)    Protein, ur 30 (*)    Nitrite POSITIVE (*)    Leukocytes,Ua LARGE (*)    All other components within  normal limits   No Known Allergies                                             Past Medical History:  Diagnosis Date   Polysubstance abuse ([place])    Social History   Socioeconomic History   Marital status: Single    Spouse name: Not on file   Number of children: Not on file   Years of education: Not on file   Highest education level: Not on file  Occupational History   Not on file  Social Needs   Financial resource strain: Not on file   Food insecurity    Worry: Not on file    Inability: Not on file   Transportation needs    Medical: Not on file    Non-medical: Not on file  Tobacco Use   Smoking status: Current Every Day Smoker   Smokeless tobacco: Never Used  Substance and Sexual Activity   Alcohol use: Yes   Drug use: Yes    Types: Cocaine, Marijuana   Sexual activity: Not on file  Lifestyle   Physical activity    Days per week: Not on file    Minutes per session: Not on file   Stress: Not on file  Relationships   Social connections    Talks on phone: Not on file    Gets together: Not on file    Attends religious service: Not on file    Active member of club or organization: Not on file    Attends meetings of clubs or organizations: Not on file    Relationship status: Not on file   Intimate partner violence    Fear of current or ex partner: Not on file    Emotionally abused: Not on file    Physically abused: Not on file    Forced sexual activity: Not on file  Other Topics Concern   Not on file  Social History Narrative   Not on file   Family History  Problem Relation Age of Onset   Supraventricular tachycardia Mother      Vanessa Kick, MD 03/22/19 [phone]

## 2019-03-22 NOTE — ED Triage Notes (Signed)
Pt went to the ER on 03/18/19. Pt states he has pain in his right side lower back. Pt states he can't get comfortable any kind way.

## 2019-03-24 LAB — URINE CULTURE: Culture: >=100000 — AB

## 2019-03-26 ENCOUNTER — Telehealth (HOSPITAL_COMMUNITY): Payer: Self-pay | Admitting: Emergency Medicine

## 2019-03-26 NOTE — Telephone Encounter (Signed)
Urine culture was positive for e coli and was given  Bactrim  at urgent care visit.Attempted to reach patient. No answer at this time.   

## 2019-03-29 ENCOUNTER — Encounter (HOSPITAL_COMMUNITY): Payer: Self-pay | Admitting: Emergency Medicine

## 2019-03-29 ENCOUNTER — Other Ambulatory Visit: Payer: Self-pay

## 2019-03-29 ENCOUNTER — Ambulatory Visit (HOSPITAL_COMMUNITY)
Admission: EM | Admit: 2019-03-29 | Discharge: 2019-03-29 | Disposition: A | Discharge status: Home / Self Care | Payer: Self-pay | Attending: Family Medicine | Admitting: Family Medicine

## 2019-03-29 DIAGNOSIS — M533 Sacrococcygeal disorders, not elsewhere classified: Secondary | ICD-10-CM

## 2019-03-29 LAB — POCT URINALYSIS DIP (DEVICE)
Bilirubin Urine: NEGATIVE
Glucose, UA: NEGATIVE mg/dL
Ketones, ur: NEGATIVE mg/dL
Leukocytes,Ua: NEGATIVE
Nitrite: NEGATIVE
Protein, ur: 30 mg/dL — AB
Specific Gravity, Urine: >=1.03 (ref 1.005–1.030)
Urobilinogen, UA: 0.2 mg/dL (ref 0.0–1.0)
pH: 6 (ref 5.0–8.0)

## 2019-03-29 MED ORDER — TIZANIDINE HCL 4 MG PO TABS: 4.0000 mg | ORAL_TABLET | Freq: Four times a day (QID) | ORAL | 0 refills | Status: DC | PRN

## 2019-03-29 MED ORDER — METHYLPREDNISOLONE 4 MG PO TBPK: ORAL_TABLET | ORAL | 0 refills | Status: DC

## 2019-03-29 NOTE — ED Triage Notes (Signed)
PT reports right flank pain, right lower back pain. PT has been seen in the ED and UC over the last 1.5 weeks. PT was diagnosed with a UTI at last UC visit. PT has finished all antibiotics. No improvement in back pain. PT has also used ibuprofen and a heating pad 

## 2019-03-29 NOTE — Discharge Instructions (Signed)
Take the Medrol Dosepak as directed.  This is a prednisone medication, a strong anti-inflammatory pain medicine Use warmth to the painful low back area Gentle stretches twice a day.  Lay on your back and bring your knees to your chest as demonstrated Take the muscle relaxer if needed.  This is useful to take at bedtime Off work until Monday  Urine test is clear

## 2019-03-29 NOTE — ED Provider Notes (Signed)
[place]    CSN: [id number] Arrival date & time: 03/29/19  1349      History   Chief Complaint Chief Complaint  Patient presents with  . Back Pain    HPI Victor Monroe is a 33 y.o. male.   HPI this is the patient's third visit in 2 weeks for low back pain.  He has pain in the right low back.  He points to his posterior pelvis.  He states this is been persistent for 2 weeks.  At his first visit he was seen in the emergency department and thought to have a kidney stone.  He had a chest x-ray and a CT abdomen.  Urinalysis and blood work.  No significant abnormality identified.  He had no urinary symptoms.  He was told to go home take Tylenol and use a hot pack. He came back to the urgent care center 2 days later.  Still having pain.  Urinalysis was positive for nitrites and leukocytes.  Culture positive for E. coli.  He was treated with 5 days of antibiotics. He took his 5 days of antibiotics.  He is here because he persists in having low back pain.  He states this is never gotten better.  It hurts with bending.  It hurts with lifting.  It hurts from his right low back into his right buttock region.  No numbness to weakness into the legs.  No fever chills.  No nausea vomiting.  No dysuria no frequency no penile discharge.   Past Medical History:  Diagnosis Date  . Polysubstance abuse [facility])     Patient Active Problem List   Diagnosis Date Noted  . 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 ABDOMINAL SURGERY    . LAPAROTOMY N/A 01/22/2015   Procedure: EXPLORATORY LAPAROTOMY,  GRAHAM PATCH CLOSURE OF PERFORATED DUODENAL ULCER;  Surgeon: Armandina Gemma, MD;  Location: [facility];  Service: General;  Laterality: N/A;       Home Medications    Prior to Admission medications   Medication Sig Start Date End Date Taking? Authorizing Provider  ibuprofen (ADVIL,MOTRIN) 800 MG tablet  Take 1 tablet (800 mg total) by mouth 3 (three) times daily. 11/09/18  Yes Raylene Everts, MD  methylPREDNISolone (MEDROL DOSEPAK) 4 MG TBPK tablet tad 03/29/19   Raylene Everts, MD  tiZANidine (ZANAFLEX) 4 MG tablet Take 1-2 tablets (4-8 mg total) by mouth every 6 (six) hours as needed for muscle spasms. 03/29/19   Raylene Everts, MD    Family History Family History  Problem Relation Age of Onset  . Supraventricular tachycardia Mother     Social History Social History   Tobacco Use  . Smoking status: Current Every Day Smoker  . Smokeless tobacco: Never Used  Substance Use Topics  . Alcohol use: Yes  . Drug use: Yes    Types: Cocaine, Marijuana     Allergies   Patient has no known allergies.   Review of Systems Review of Systems  Constitutional: Negative for chills and fever.  HENT: Negative for ear pain and sore throat.   Eyes: Negative for pain and visual disturbance.  Respiratory: Negative for cough and shortness of breath.   Cardiovascular: Negative for chest pain and palpitations.  Gastrointestinal: Negative for abdominal pain and vomiting.  Genitourinary: Negative for dysuria and hematuria.  Musculoskeletal: Positive for back pain. Negative for arthralgias.  Skin: Negative for color  change and rash.  Neurological: Negative for seizures and syncope.  All other systems reviewed and are negative.    Physical Exam Triage Vital Signs ED Triage Vitals [03/29/19 1452]  Enc Vitals Group     BP (!) 143/88     Pulse Rate 85     Resp 16     Temp 99.2 F (37.3 C)     Temp Source Oral     SpO2 96 %     Weight      Height      Head Circumference      Peak Flow      Pain Score 3     Pain Loc      Pain Edu?      Excl. in GC?    No data found.  Updated Vital Signs BP (!) 143/88   Pulse 85   Temp 99.2 F (37.3 C) (Oral)   Resp 16   SpO2 96%     Physical Exam Constitutional:      General: He is not in acute distress.    Appearance: He is  well-developed and normal weight.     Comments: Does not appear uncomfortable.  Normal gait.  Normal movements  HENT:     Head: Normocephalic and atraumatic.  Eyes:     Conjunctiva/sclera: Conjunctivae normal.     Pupils: Pupils are equal, round, and reactive to light.  Neck:     Musculoskeletal: Normal range of motion and neck supple.  Cardiovascular:     Rate and Rhythm: Normal rate and regular rhythm.     Heart sounds: Normal heart sounds.  Pulmonary:     Effort: Pulmonary effort is normal. No respiratory distress.     Breath sounds: Normal breath sounds.  Abdominal:     General: There is no distension.     Palpations: Abdomen is soft.     Tenderness: There is no right CVA tenderness or left CVA tenderness.     Comments: Protuberant abdomen.  Soft and benign  Musculoskeletal: Normal range of motion.     Comments: Tenderness to deep palpation of the right SI joint.  No muscle spasm.  Good range of motion.  Site sensation range of motion and reflexes are normal in both lower extremities.  Skin:    General: Skin is warm and dry.  Neurological:     General: No focal deficit present.     Mental Status: He is alert.     Sensory: No sensory deficit.     Motor: No weakness.     Gait: Gait normal.     Deep Tendon Reflexes: Reflexes normal.  Psychiatric:        Mood and Affect: Mood normal.        Behavior: Behavior normal.      UC Treatments / Results  Labs (all labs ordered are listed, but only abnormal results are displayed) Labs Reviewed  POCT URINALYSIS DIP (DEVICE) - Abnormal; Notable for the following components:      Result Value   Hgb urine dipstick SMALL (*)    Protein, ur 30 (*)    All other components within normal limits    EKG None  Radiology No results found.  Procedures Procedures (including critical care time)  Medications Ordered in UC Medications - No data to display  Initial Impression / Assessment and Plan / UC Course  I have reviewed the  triage vital signs and the nursing notes.  Pertinent labs & imaging results that  were available during my care of the patient were reviewed by me and considered in my medical decision making (see chart for details).     I showed the patient some SI exercises to perform.  Warm compresses.  We will do a Medrol Dosepak.  His urine is cleared up.  No evidence of infection. Final Clinical Impressions(s) / UC Diagnoses   Final diagnoses:  Sacroiliac pain     Discharge Instructions     Take the Medrol Dosepak as directed.  This is a prednisone medication, a strong anti-inflammatory pain medicine Use warmth to the painful low back area Gentle stretches twice a day.  Lay on your back and bring your knees to your chest as demonstrated Take the muscle relaxer if needed.  This is useful to take at bedtime Off work until Monday  Urine test is clear   ED Prescriptions    Medication Sig Dispense Auth. Provider   methylPREDNISolone (MEDROL DOSEPAK) 4 MG TBPK tablet tad 21 tablet Eustace MooreNelson, Atha Muradyan Sue, MD   tiZANidine (ZANAFLEX) 4 MG tablet Take 1-2 tablets (4-8 mg total) by mouth every 6 (six) hours as needed for muscle spasms. 21 tablet Eustace MooreNelson, Johanne Mcglade Sue, MD     Controlled Substance Prescriptions [place] Controlled Substance Registry consulted? Not Applicable   Eustace MooreNelson, Warnie Belair Sue, MD 03/29/19 1806

## 2019-03-29 NOTE — ED Triage Notes (Signed)
PT reports right flank pain, right lower back pain. PT has been seen in the ED and UC over the last 1.5 weeks. PT was diagnosed with a UTI at last UC visit. PT has finished all antibiotics. No improvement in back pain. PT has also used ibuprofen and a heating pad

## 2019-04-10 ENCOUNTER — Ambulatory Visit (HOSPITAL_COMMUNITY)
Admission: EM | Admit: 2019-04-10 | Discharge: 2019-04-10 | Disposition: A | Discharge status: Home / Self Care | Payer: Self-pay | Attending: Family Medicine | Admitting: Family Medicine

## 2019-04-10 ENCOUNTER — Other Ambulatory Visit: Payer: Self-pay

## 2019-04-10 ENCOUNTER — Encounter (HOSPITAL_COMMUNITY): Payer: Self-pay

## 2019-04-10 DIAGNOSIS — R030 Elevated blood-pressure reading, without diagnosis of hypertension: Secondary | ICD-10-CM

## 2019-04-10 DIAGNOSIS — K649 Unspecified hemorrhoids: Secondary | ICD-10-CM

## 2019-04-10 NOTE — ED Triage Notes (Signed)
Patient presents to Urgent Care with complaints of need for note to be out of work since his hemorrhoids flared up. Patient reports he has noticed some blood in his stool recently as well.

## 2019-04-13 NOTE — ED Provider Notes (Signed)
[facility]   [id number] 04/10/19 Arrival Time: 1659  ASSESSMENT & PLAN:  1. Hemorrhoids, unspecified hemorrhoid type   2. Elevated blood pressure reading without diagnosis of hypertension    Work note given. Reports hemorrhoids improving. Will try and check BP as outpatient. May return here as needed.  Reviewed expectations re: course of current medical issues. Questions answered. Outlined signs and symptoms indicating need for more acute intervention. Patient verbalized understanding. After Visit Summary given.   SUBJECTIVE: History from: patient.  Victor Monroe is a 33 y.o. male who presents with complaint of hemorrhoids. Three days. Has had before. Initial bleeding at first. None over the past 48 hours. Less painful now. OTC cream with some help. No diarrhea. Constipation preceded. Normal PO intake without n/v. No abdominal or low back pain. Weight stable. Ambulatory without difficulty. Does have trouble standing at work until hemorrhoids fully resolve. Requests work note. No specific aggravating or alleviating factors reported.  Past Surgical History:  Procedure Laterality Date  . ABDOMINAL SURGERY    . LAPAROTOMY N/A 01/22/2015   Procedure: EXPLORATORY LAPAROTOMY,  GRAHAM PATCH CLOSURE OF PERFORATED DUODENAL ULCER;  Surgeon: Darnell Levelodd Gerkin, MD;  Location: [facility];  Service: General;  Laterality: N/A;    ROS: As per HPI. All other systems negative.   OBJECTIVE:  Vitals:   04/10/19 1740  BP: (!) 153/102  Pulse: 84  Resp: 18  Temp: 98.8 F (37.1 C)  TempSrc: Oral  SpO2: 100%    General appearance: alert; no distress Oropharynx: moist Lungs: clear to auscultation bilaterally; unlabored Heart: regular rate and rhythm Abdomen: soft; non-distended; no significant abdominal tenderness; bowel sounds present; no masses or organomegaly; no guarding or rebound tenderness Rectal: he declines Back: no CVA tenderness Extremities: no edema; symmetrical with no  gross deformities Skin: warm; dry Neurologic: normal gait Psychological: alert and cooperative; normal mood and affect   No Known Allergies                                             Past Medical History:  Diagnosis Date  . Polysubstance abuse ([facility])    Social History   Socioeconomic History  . Marital status: Single    Spouse name: Not on file  . Number of children: Not on file  . Years of education: Not on file  . Highest education level: Not on file  Occupational History  . Not on file  Social Needs  . Financial resource strain: Not on file  . Food insecurity    Worry: Not on file    Inability: Not on file  . Transportation needs    Medical: Not on file    Non-medical: Not on file  Tobacco Use  . Smoking status: Current Every Day Smoker  . Smokeless tobacco: Never Used  Substance and Sexual Activity  . Alcohol use: Yes  . Drug use: Yes    Types: Cocaine, Marijuana  . Sexual activity: Not on file  Lifestyle  . Physical activity    Days per week: Not on file    Minutes per session: Not on file  . Stress: Not on file  Relationships  . Social Musicianconnections    Talks on phone: Not on file    Gets together: Not on file    Attends religious service: Not on file    Active member of club or organization: Not  on file    Attends meetings of clubs or organizations: Not on file    Relationship status: Not on file  . Intimate partner violence    Fear of current or ex partner: Not on file    Emotionally abused: Not on file    Physically abused: Not on file    Forced sexual activity: Not on file  Other Topics Concern  . Not on file  Social History Narrative  . Not on file   Family History  Problem Relation Age of Onset  . Supraventricular tachycardia Mother   . Healthy Father      Vanessa Kick, MD 04/13/19 [phone]

## 2019-05-12 ENCOUNTER — Other Ambulatory Visit: Payer: Self-pay

## 2019-05-12 ENCOUNTER — Emergency Department (HOSPITAL_COMMUNITY)
Admission: EM | Admit: 2019-05-12 | Discharge: 2019-05-12 | Disposition: A | Discharge status: Home / Self Care | Payer: Self-pay | Attending: Emergency Medicine | Admitting: Emergency Medicine

## 2019-05-12 ENCOUNTER — Encounter (HOSPITAL_COMMUNITY): Payer: Self-pay

## 2019-05-12 DIAGNOSIS — M549 Dorsalgia, unspecified: Secondary | ICD-10-CM | POA: Insufficient documentation

## 2019-05-12 DIAGNOSIS — Y939 Activity, unspecified: Secondary | ICD-10-CM | POA: Insufficient documentation

## 2019-05-12 DIAGNOSIS — M79621 Pain in right upper arm: Secondary | ICD-10-CM | POA: Insufficient documentation

## 2019-05-12 DIAGNOSIS — Y999 Unspecified external cause status: Secondary | ICD-10-CM | POA: Insufficient documentation

## 2019-05-12 DIAGNOSIS — M79622 Pain in left upper arm: Secondary | ICD-10-CM | POA: Insufficient documentation

## 2019-05-12 DIAGNOSIS — F17210 Nicotine dependence, cigarettes, uncomplicated: Secondary | ICD-10-CM | POA: Insufficient documentation

## 2019-05-12 DIAGNOSIS — F1290 Cannabis use, unspecified, uncomplicated: Secondary | ICD-10-CM | POA: Insufficient documentation

## 2019-05-12 DIAGNOSIS — Y929 Unspecified place or not applicable: Secondary | ICD-10-CM | POA: Insufficient documentation

## 2019-05-12 MED ORDER — IBUPROFEN 800 MG PO TABS: 800.0000 mg | ORAL_TABLET | Freq: Three times a day (TID) | ORAL | 0 refills | Status: DC

## 2019-05-12 MED ORDER — TIZANIDINE HCL 4 MG PO TABS: 4.0000 mg | ORAL_TABLET | Freq: Four times a day (QID) | ORAL | 0 refills | Status: DC | PRN

## 2019-05-12 NOTE — ED Notes (Signed)
Patient verbalizes understanding of discharge instructions. Opportunity for questioning and answers were provided. Armband removed by staff, pt discharged from ED.  
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
## 2019-05-13 ENCOUNTER — Ambulatory Visit (HOSPITAL_COMMUNITY)
Admission: EM | Admit: 2019-05-13 | Discharge: 2019-05-13 | Disposition: A | Discharge status: Home / Self Care | Payer: Self-pay | Attending: Family Medicine | Admitting: Family Medicine

## 2019-05-13 ENCOUNTER — Encounter (HOSPITAL_COMMUNITY): Payer: Self-pay

## 2019-05-13 DIAGNOSIS — M791 Myalgia, unspecified site: Secondary | ICD-10-CM
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
## 2019-08-06 ENCOUNTER — Encounter (HOSPITAL_COMMUNITY): Payer: Self-pay

## 2019-08-06 ENCOUNTER — Ambulatory Visit (HOSPITAL_COMMUNITY)
Admission: EM | Admit: 2019-08-06 | Discharge: 2019-08-06 | Disposition: A | Discharge status: Home / Self Care | Payer: Self-pay | Attending: Family Medicine | Admitting: Family Medicine

## 2019-08-06 ENCOUNTER — Other Ambulatory Visit: Payer: Self-pay

## 2019-08-06 DIAGNOSIS — F1911 Other psychoactive substance abuse, in remission: Secondary | ICD-10-CM | POA: Insufficient documentation

## 2019-08-06 DIAGNOSIS — Z20828 Contact with and (suspected) exposure to other viral communicable diseases: Secondary | ICD-10-CM | POA: Insufficient documentation

## 2019-08-06 DIAGNOSIS — F17210 Nicotine dependence, cigarettes, uncomplicated: Secondary | ICD-10-CM | POA: Insufficient documentation

## 2019-08-06 DIAGNOSIS — K529 Noninfective gastroenteritis and colitis, unspecified: Secondary | ICD-10-CM | POA: Insufficient documentation

## 2019-08-06 LAB — POCT URINALYSIS DIP (DEVICE)
Bilirubin Urine: NEGATIVE
Glucose, UA: NEGATIVE mg/dL
Ketones, ur: NEGATIVE mg/dL
Leukocytes,Ua: NEGATIVE
Nitrite: NEGATIVE
Protein, ur: 30 mg/dL — AB
Specific Gravity, Urine: 1.025 (ref 1.005–1.030)
Urobilinogen, UA: 0.2 mg/dL (ref 0.0–1.0)
pH: 6 (ref 5.0–8.0)

## 2019-08-06 MED ORDER — ONDANSETRON 4 MG PO TBDP: 4.0000 mg | ORAL_TABLET | Freq: Three times a day (TID) | ORAL | 0 refills | Status: DC | PRN

## 2019-08-06 NOTE — ED Triage Notes (Signed)
Patient presents to Urgent Care with complaints of sudden emesis and diarrhea since this morning. Patient reports he does not think he ate anything odd last night, has not tried to keep anything down today.

## 2019-08-06 NOTE — ED Provider Notes (Signed)
[facility]   [id number] 08/06/19 Arrival Time: 4098  ASSESSMENT & PLAN:  1. Gastroenteritis      COVID-19 testing sent. To self-quarantine until results are available. No signs of dehydration requiring IVF at this time.  Meds ordered this encounter  Medications  . ondansetron (ZOFRAN-ODT) 4 MG disintegrating tablet    Sig: Take 1 tablet (4 mg total) by mouth every 8 (eight) hours as needed for nausea or vomiting.    Dispense:  15 tablet    Refill:  0    Discussed typical duration of symptoms for suspected viral GI illness. Will do his best to ensure adequate fluid intake in order to avoid dehydration. Will proceed to the Emergency Department for evaluation if unable to tolerate PO fluids regularly. Work note provided. Otherwise he will f/u with his PCP or here if not showing improvement over the next 48-72 hours.  Reviewed expectations re: course of current medical issues. Questions answered. Outlined signs and symptoms indicating need for more acute intervention. Patient verbalized understanding. After Visit Summary given.   SUBJECTIVE: History from: patient.  Victor Monroe is a 33 y.o. male who presents with complaint of non-bilious, non-bloody intermittent nausea and vomiting with non-bloody diarrhea. Onset this morning upon waking. Abdominal discomfort: mild and cramping. Symptoms are unchanged since beginning. Aggravating factors: eating. Alleviating factors: none. Associated symptoms: mild fatigue. He denies arthralgias, belching, chills, dysuria, fever, headache, myalgias and sweats. Appetite: decreased. PO intake: decreased. Ambulatory without assistance. Urinary symptoms: none. Sick contacts: none. Recent travel or camping: none. OTC treatment: none.  No LMP for male patient.  Past Surgical History:  Procedure Laterality Date  . ABDOMINAL SURGERY    . LAPAROTOMY N/A 01/22/2015   Procedure: EXPLORATORY LAPAROTOMY,  GRAHAM PATCH CLOSURE OF  PERFORATED DUODENAL ULCER;  Surgeon: Darnell Level, MD;  Location: [facility];  Service: General;  Laterality: N/A;    ROS: As per HPI.  OBJECTIVE:  Vitals:   08/06/19 0953  BP: (!) 148/93  Pulse: 75  Resp: 16  Temp: 98.4 F (36.9 C)  TempSrc: Oral  SpO2: 99%    General appearance: alert; no distress Oropharynx: moist Lungs: clear to auscultation bilaterally; unlabored Heart: regular rate and rhythm Abdomen: obese; soft; non-distended; no significant abdominal tenderness; reports "cramping" feeling with palpation; bowel sounds present; no masses or organomegaly; no guarding or rebound tenderness Back: no CVA tenderness Extremities: no edema; symmetrical with no gross deformities Skin: warm; dry Neurologic: normal gait Psychological: alert and cooperative; normal mood and affect  Labs: Results for orders placed or performed during the hospital encounter of 08/06/19  POCT urinalysis dip (device)  Result Value Ref Range   Glucose, UA NEGATIVE NEGATIVE mg/dL   Bilirubin Urine NEGATIVE NEGATIVE   Ketones, ur NEGATIVE NEGATIVE mg/dL   Specific Gravity, Urine 1.025 1.005 - 1.030   Hgb urine dipstick TRACE (A) NEGATIVE   pH 6.0 5.0 - 8.0   Protein, ur 30 (A) NEGATIVE mg/dL   Urobilinogen, UA 0.2 0.0 - 1.0 mg/dL   Nitrite NEGATIVE NEGATIVE   Leukocytes,Ua NEGATIVE NEGATIVE   Labs Reviewed  POCT URINALYSIS DIP (DEVICE) - Abnormal; Notable for the following components:      Result Value   Hgb urine dipstick TRACE (*)    Protein, ur 30 (*)    All other components within normal limits  NOVEL CORONAVIRUS, NAA (HOSP ORDER, SEND-OUT TO REF LAB; TAT 18-24 HRS)    No Known Allergies  Past Medical History:  Diagnosis Date  . Polysubstance abuse ([place])    Social History   Socioeconomic History  . Marital status: Single    Spouse name: Not on file  . Number of children: Not on file  . Years of education: Not on file  . Highest education  level: Not on file  Occupational History  . Not on file  Social Needs  . Financial resource strain: Not on file  . Food insecurity    Worry: Not on file    Inability: Not on file  . Transportation needs    Medical: Not on file    Non-medical: Not on file  Tobacco Use  . Smoking status: Current Every Day Smoker    Packs/day: 0.50    Years: 20.00    Pack years: 10.00    Types: Cigarettes  . Smokeless tobacco: Never Used  Substance and Sexual Activity  . Alcohol use: Yes    Alcohol/week: 3.0 standard drinks    Types: 3 Glasses of wine per week    Comment: occassionally  . Drug use: Yes    Types: Marijuana    Comment: Pt reports he used to do cocaine but quit  . Sexual activity: Not on file  Lifestyle  . Physical activity    Days per week: Not on file    Minutes per session: Not on file  . Stress: Not on file  Relationships  . Social Herbalist on phone: Not on file    Gets together: Not on file    Attends religious service: Not on file    Active member of club or organization: Not on file    Attends meetings of clubs or organizations: Not on file    Relationship status: Not on file  . Intimate partner violence    Fear of current or ex partner: Not on file    Emotionally abused: Not on file    Physically abused: Not on file    Forced sexual activity: Not on file  Other Topics Concern  . Not on file  Social History Narrative  . Not on file   Family History  Problem Relation Age of Onset  . Supraventricular tachycardia Mother   . Healthy Father      Vanessa Kick, MD 08/06/19 1045

## 2019-08-06 NOTE — Discharge Instructions (Addendum)
Please do your best to ensure adequate fluid intake in order to avoid dehydration. If you find that you are unable to tolerate drinking fluids regularly please proceed to the Emergency Department for evaluation.  Also, you should return to the hospital if you experience persistent fevers for greater than 1-2 more days, increasing abdominal pain that persists despite medications, persistent diarrhea, dizziness, syncope (fainting), or for any other concerns you may deem worrisome.  Your blood pressure was noted to be elevated during your visit today. You may return here within the next few days to recheck if unable to see your primary care doctor. If your blood pressure remains persistently elevated, you may need to begin taking a medication.  BP (!) 148/93 (BP Location: Left Arm)    Pulse 75    Temp 98.4 F (36.9 C) (Oral)    Resp 16    SpO2 99%

## 2019-08-08 LAB — NOVEL CORONAVIRUS, NAA (HOSP ORDER, SEND-OUT TO REF LAB; TAT 18-24 HRS): SARS-CoV-2, NAA: NOT DETECTED

## 2019-11-20 ENCOUNTER — Encounter (HOSPITAL_COMMUNITY): Payer: Self-pay

## 2019-11-20 ENCOUNTER — Other Ambulatory Visit: Payer: Self-pay

## 2019-11-20 ENCOUNTER — Ambulatory Visit (HOSPITAL_COMMUNITY)
Admission: EM | Admit: 2019-11-20 | Discharge: 2019-11-20 | Disposition: A | Discharge status: Home / Self Care | Payer: HRSA Program | Attending: Family Medicine | Admitting: Family Medicine

## 2019-11-20 DIAGNOSIS — R0981 Nasal congestion: Secondary | ICD-10-CM | POA: Insufficient documentation

## 2019-11-20 DIAGNOSIS — F1911 Other psychoactive substance abuse, in remission: Secondary | ICD-10-CM | POA: Insufficient documentation

## 2019-11-20 DIAGNOSIS — R197 Diarrhea, unspecified: Secondary | ICD-10-CM | POA: Insufficient documentation

## 2019-11-20 DIAGNOSIS — R1013 Epigastric pain: Secondary | ICD-10-CM | POA: Insufficient documentation

## 2019-11-20 DIAGNOSIS — R05 Cough: Secondary | ICD-10-CM | POA: Insufficient documentation

## 2019-11-20 DIAGNOSIS — Z79899 Other long term (current) drug therapy: Secondary | ICD-10-CM | POA: Diagnosis not present

## 2019-11-20 DIAGNOSIS — R112 Nausea with vomiting, unspecified: Secondary | ICD-10-CM | POA: Insufficient documentation

## 2019-11-20 DIAGNOSIS — F17210 Nicotine dependence, cigarettes, uncomplicated: Secondary | ICD-10-CM | POA: Diagnosis not present

## 2019-11-20 DIAGNOSIS — Z20822 Contact with and (suspected) exposure to covid-19: Secondary | ICD-10-CM | POA: Insufficient documentation

## 2019-11-20 DIAGNOSIS — Z791 Long term (current) use of non-steroidal anti-inflammatories (NSAID): Secondary | ICD-10-CM | POA: Insufficient documentation

## 2019-11-20 MED ORDER — ONDANSETRON 4 MG PO TBDP: 4.0000 mg | ORAL_TABLET | Freq: Three times a day (TID) | ORAL | 0 refills | Status: DC | PRN

## 2019-11-20 MED ORDER — OMEPRAZOLE 20 MG PO CPDR: 20.0000 mg | DELAYED_RELEASE_CAPSULE | Freq: Two times a day (BID) | ORAL | 0 refills | Status: DC

## 2019-11-20 NOTE — Discharge Instructions (Signed)
Covid swab pending, quarantine until results return.  If positive you will need to quarantine for full 10 days as well as until symptoms improving.  For nausea: Zofran prescribed. Begin with every 6 hours, than as you are able to hold food down, take it as needed. Start with clear liquids, then move to plain foods like bananas, rice, applesauce, toast, broth, grits, oatmeal. As those food settle okay you may transition to your normal foods. Avoid spicy and greasy foods as much as possible.  For Diarrhea: This is your body's natural way of getting rid of a virus. You may try taking imodium or peptobismol to decrease amount of stools a day, but we do not want you to stop your diarrhea.   Preventing dehydration is key! You need to replace the fluid your body is expelling. Drink plenty of fluids, may use Pedialyte or sports drinks.   Please return if you are experiencing blood in your vomit or stool or experiencing dizziness, lightheadedness, extreme fatigue, increased abdominal pain, fevers

## 2019-11-20 NOTE — ED Provider Notes (Signed)
[facility]    CSN: [id number] Arrival date & time: 11/20/19  7510      History   Chief Complaint Chief Complaint  Patient presents with  . Emesis    HPI Arley Salamone is a 34 y.o. male history of prior perforated duodenal ulcer presenting today for evaluation of nausea vomiting and diarrhea.  Patient states that beginning last night he began to have frequent episodes of vomiting and nausea.  He also notes that he has had more frequent looser bowel movements, but states that only a small amount comes out with stools.  Denies blood in stool or vomit.  Does have some abdominal pain that waxes and wanes with episodes of vomiting.  Rates pain 6 out of 10.  Denies feeling similar to prior duodenal ulcer pain.  He is not currently on any antacids.  Denies any fevers, body aches.  Has felt mildly chilled.  Reports mild congestion and mild cough.  Denies chest pain or shortness of breath.  HPI  Past Medical History:  Diagnosis Date  . Polysubstance abuse [facility])     Patient Active Problem List   Diagnosis Date Noted  . 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 Perforated duodenal ulcer (HCC) 01/22/2015    Past Surgical History:  Procedure Laterality Date  . ABDOMINAL SURGERY    . LAPAROTOMY N/A 01/22/2015   Procedure: EXPLORATORY LAPAROTOMY,  GRAHAM PATCH CLOSURE OF PERFORATED DUODENAL ULCER;  Surgeon: Darnell Level, MD;  Location: [facility];  Service: General;  Laterality: N/A;       Home Medications    Prior to Admission medications   Medication Sig Start Date End Date Taking? Authorizing Provider  ibuprofen (ADVIL) 800 MG tablet Take 1 tablet (800 mg total) by mouth 3 (three) times daily. 05/12/19   Fayrene Helper, PA-C  omeprazole (PRILOSEC) 20 MG capsule Take 1 capsule (20 mg total) by mouth 2 (two) times daily before a meal for 14 days. 11/20/19 12/04/19  Maanasa Aderhold C, PA-C  ondansetron (ZOFRAN ODT) 4 MG disintegrating tablet Take 1 tablet (4 mg total) by mouth every 8 (eight)  hours as needed for nausea or vomiting. 11/20/19   Lashayla Armes C, PA-C  tiZANidine (ZANAFLEX) 4 MG tablet Take 1-2 tablets (4-8 mg total) by mouth every 6 (six) hours as needed for muscle spasms. 05/12/19   Fayrene Helper, PA-C    Family History Family History  Problem Relation Age of Onset  . Supraventricular tachycardia Mother   . Healthy Father     Social History Social History   Tobacco Use  . Smoking status: Current Every Day Smoker    Packs/day: 0.50    Years: 20.00    Pack years: 10.00    Types: Cigarettes  . Smokeless tobacco: Never Used  Substance Use Topics  . Alcohol use: Yes    Alcohol/week: 3.0 standard drinks    Types: 3 Glasses of wine per week    Comment: occassionally  . Drug use: Yes    Types: Marijuana    Comment: Pt reports he used to do cocaine but quit     Allergies   Patient has no known allergies.   Review of Systems Review of Systems  Constitutional: Positive for appetite change and chills. Negative for activity change, fatigue and fever.  HENT: Positive for congestion. Negative for ear pain, rhinorrhea, sinus pressure, sore throat and trouble swallowing.   Eyes: Negative for discharge and redness.  Respiratory: Positive for cough. Negative for chest tightness and shortness of  breath.   Cardiovascular: Negative for chest pain.  Gastrointestinal: Positive for abdominal pain, diarrhea, nausea and vomiting.  Musculoskeletal: Negative for myalgias.  Skin: Negative for rash.  Neurological: Negative for dizziness, light-headedness and headaches.     Physical Exam Triage Vital Signs ED Triage Vitals  Enc Vitals Group     BP 11/20/19 1016 (!) 161/112     Pulse Rate 11/20/19 1016 72     Resp 11/20/19 1016 18     Temp 11/20/19 1016 98.9 F (37.2 C)     Temp src --      SpO2 11/20/19 1016 98 %     Weight 11/20/19 1013 (!) 331 lb 3.2 oz (150.2 kg)     Height --      Head Circumference --      Peak Flow --      Pain Score 11/20/19 1013 6      Pain Loc --      Pain Edu? --      Excl. in GC? --    No data found.  Updated Vital Signs BP (!) 161/112 (BP Location: Right Arm)   Pulse 72   Temp 98.9 F (37.2 C)   Resp 18   Wt (!) 331 lb 3.2 oz (150.2 kg)   SpO2 98%   BMI 48.91 kg/m   Visual Acuity Right Eye Distance:   Left Eye Distance:   Bilateral Distance:    Right Eye Near:   Left Eye Near:    Bilateral Near:     Physical Exam Vitals and nursing note reviewed.  Constitutional:      Appearance: He is well-developed.  HENT:     Head: Normocephalic and atraumatic.     Ears:     Comments: Bilateral ears without tenderness to palpation of external auricle, tragus and mastoid, EAC's without erythema or swelling, TM's with good bony landmarks and cone of light. Non erythematous.     Mouth/Throat:     Comments: Oral mucosa pink and moist, no tonsillar enlargement or exudate. Posterior pharynx patent and nonerythematous, no uvula deviation or swelling. Normal phonation. Eyes:     Conjunctiva/sclera: Conjunctivae normal.  Cardiovascular:     Rate and Rhythm: Normal rate and regular rhythm.     Heart sounds: No murmur.  Pulmonary:     Effort: Pulmonary effort is normal. No respiratory distress.     Breath sounds: Normal breath sounds.     Comments: Breathing comfortably at rest, CTABL, no wheezing, rales or other adventitious sounds auscultated Abdominal:     Palpations: Abdomen is soft.     Tenderness: There is abdominal tenderness.     Comments: Well-healed midline surgical scar, soft, nondistended, tender to palpation in epigastrium and mid upper abdomen, nontender to bilateral upper and lower quadrants, negative rebound, negative Murphy's, negative McBurney's  Musculoskeletal:     Cervical back: Neck supple.  Skin:    General: Skin is warm and dry.  Neurological:     Mental Status: He is alert.      UC Treatments / Results  Labs (all labs ordered are listed, but only abnormal results are  displayed) Labs Reviewed  NOVEL CORONAVIRUS, NAA (HOSP ORDER, SEND-OUT TO REF LAB; TAT 18-24 HRS)    EKG   Radiology No results found.  Procedures Procedures (including critical care time)  Medications Ordered in UC Medications - No data to display  Initial Impression / Assessment and Plan / UC Course  I have reviewed the triage vital signs  and the nursing notes.  Pertinent labs & imaging results that were available during my care of the patient were reviewed by me and considered in my medical decision making (see chart for details).     1 day of nausea vomiting and diarrhea, mild URI symptoms.  Covid PCR pending.  Negative peritoneal signs on exam, do not suspect abdominal emergency at this time.  Likely viral etiology and will recommend symptomatic and supportive care with continued close monitoring.  Symptoms do not seem similar to patient's past experience with duodenal ulcers.  Will reinitiate on omeprazole twice daily along with Zofran as needed for nausea.  Rest and push fluids.  Discussed strict return precautions. Patient verbalized understanding and is agreeable with plan.  Final Clinical Impressions(s) / UC Diagnoses   Final diagnoses:  Nausea vomiting and diarrhea  Epigastric pain     Discharge Instructions     Covid swab pending, quarantine until results return.  If positive you will need to quarantine for full 10 days as well as until symptoms improving.  For nausea: Zofran prescribed. Begin with every 6 hours, than as you are able to hold food down, take it as needed. Start with clear liquids, then move to plain foods like bananas, rice, applesauce, toast, broth, grits, oatmeal. As those food settle okay you may transition to your normal foods. Avoid spicy and greasy foods as much as possible.  For Diarrhea: This is your body's natural way of getting rid of a virus. You may try taking imodium or peptobismol to decrease amount of stools a day, but we do not  want you to stop your diarrhea.   Preventing dehydration is key! You need to replace the fluid your body is expelling. Drink plenty of fluids, may use Pedialyte or sports drinks.   Please return if you are experiencing blood in your vomit or stool or experiencing dizziness, lightheadedness, extreme fatigue, increased abdominal pain, fevers   ED Prescriptions    Medication Sig Dispense Auth. Provider   ondansetron (ZOFRAN ODT) 4 MG disintegrating tablet Take 1 tablet (4 mg total) by mouth every 8 (eight) hours as needed for nausea or vomiting. 20 tablet Ciaran Begay C, PA-C   omeprazole (PRILOSEC) 20 MG capsule Take 1 capsule (20 mg total) by mouth 2 (two) times daily before a meal for 14 days. 28 capsule Ringo Sherod, [place] C, PA-C     PDMP not reviewed this encounter.   Janith Lima, PA-C 11/20/19 1047

## 2019-11-20 NOTE — ED Triage Notes (Signed)
Pt state he has been vomiting and he has some diarrhea and nausea. Pt state this started last night.

## 2019-11-22 LAB — NOVEL CORONAVIRUS, NAA (HOSP ORDER, SEND-OUT TO REF LAB; TAT 18-24 HRS): SARS-CoV-2, NAA: NOT DETECTED

## 2020-01-01 ENCOUNTER — Ambulatory Visit (HOSPITAL_COMMUNITY)
Admission: EM | Admit: 2020-01-01 | Discharge: 2020-01-01 | Disposition: A | Discharge status: Home / Self Care | Payer: Self-pay | Attending: Family Medicine | Admitting: Family Medicine

## 2020-01-01 ENCOUNTER — Other Ambulatory Visit: Payer: Self-pay

## 2020-01-01 ENCOUNTER — Encounter (HOSPITAL_COMMUNITY): Payer: Self-pay | Admitting: Family Medicine

## 2020-01-01 DIAGNOSIS — R112 Nausea with vomiting, unspecified: Secondary | ICD-10-CM | POA: Insufficient documentation

## 2020-01-01 DIAGNOSIS — R1011 Right upper quadrant pain: Secondary | ICD-10-CM | POA: Insufficient documentation

## 2020-01-01 DIAGNOSIS — Z20822 Contact with and (suspected) exposure to covid-19: Secondary | ICD-10-CM | POA: Insufficient documentation

## 2020-01-01 LAB — CBC WITH DIFFERENTIAL/PLATELET
Abs Immature Granulocytes: 0.1 10*3/uL — ABNORMAL HIGH (ref 0.00–0.07)
Basophils Absolute: 0.1 10*3/uL (ref 0.0–0.1)
Basophils Relative: 1 %
Eosinophils Absolute: 0.2 10*3/uL (ref 0.0–0.5)
Eosinophils Relative: 1 %
HCT: 49.6 % (ref 39.0–52.0)
Hemoglobin: 16.4 g/dL (ref 13.0–17.0)
Immature Granulocytes: 1 %
Lymphocytes Relative: 27 %
Lymphs Abs: 4 10*3/uL (ref 0.7–4.0)
MCH: 29.9 pg (ref 26.0–34.0)
MCHC: 33.1 g/dL (ref 30.0–36.0)
MCV: 90.3 fL (ref 80.0–100.0)
Monocytes Absolute: 0.9 10*3/uL (ref 0.1–1.0)
Monocytes Relative: 6 %
Neutro Abs: 9.8 10*3/uL — ABNORMAL HIGH (ref 1.7–7.7)
Neutrophils Relative %: 64 %
Platelets: 287 10*3/uL (ref 150–400)
RBC: 5.49 MIL/uL (ref 4.22–5.81)
RDW: 13.4 % (ref 11.5–15.5)
WBC: 15 10*3/uL — ABNORMAL HIGH (ref 4.0–10.5)
nRBC: 0 % (ref 0.0–0.2)

## 2020-01-01 LAB — COMPREHENSIVE METABOLIC PANEL
ALT: 50 U/L — ABNORMAL HIGH (ref 0–44)
AST: 54 U/L — ABNORMAL HIGH (ref 15–41)
Albumin: 4.2 g/dL (ref 3.5–5.0)
Alkaline Phosphatase: 69 U/L (ref 38–126)
Anion gap: 16 — ABNORMAL HIGH (ref 5–15)
BUN: 11 mg/dL (ref 6–20)
CO2: 23 mmol/L (ref 22–32)
Calcium: 9.5 mg/dL (ref 8.9–10.3)
Chloride: 102 mmol/L (ref 98–111)
Creatinine, Ser: 1.28 mg/dL — ABNORMAL HIGH (ref 0.61–1.24)
GFR calc Af Amer: >60 mL/min (ref >60)
GFR calc non Af Amer: >60 mL/min (ref >60)
Glucose, Bld: 108 mg/dL — ABNORMAL HIGH (ref 70–99)
Potassium: 4.2 mmol/L (ref 3.5–5.1)
Sodium: 141 mmol/L (ref 135–145)
Total Bilirubin: 1.1 mg/dL (ref 0.3–1.2)
Total Protein: 7.7 g/dL (ref 6.5–8.1)

## 2020-01-01 LAB — LIPASE, BLOOD: Lipase: 24 U/L (ref 11–51)

## 2020-01-01 LAB — SARS CORONAVIRUS 2 (TAT 6-24 HRS): SARS Coronavirus 2: NEGATIVE

## 2020-01-01 LAB — AMYLASE: Amylase: 65 U/L (ref 28–100)

## 2020-01-01 MED ORDER — ONDANSETRON 4 MG PO TBDP: 4.0000 mg | ORAL_TABLET | Freq: Three times a day (TID) | ORAL | 0 refills | Status: DC | PRN

## 2020-01-01 NOTE — Discharge Instructions (Addendum)
Covid swab sent for testing  We should have the results in 6-24 hours.  I am concerned this may be a gallbladder issue  I am drawing some lab work If your pain worsens you need to go to the ER.  Zofran as needed for nausea and vomiting.

## 2020-01-01 NOTE — ED Triage Notes (Signed)
Pt state she has needs a Covid test for work.

## 2020-01-02 ENCOUNTER — Telehealth (HOSPITAL_COMMUNITY): Payer: Self-pay

## 2020-01-02 NOTE — Telephone Encounter (Signed)
Attempted to reach patient re negative COVID.  Number is out of service.    Other results have been forwarded to provider for review.

## 2020-01-02 NOTE — ED Provider Notes (Signed)
[facility]    CSN: [id number] Arrival date & time: 01/01/20  1354      History   Chief Complaint Chief Complaint  Patient presents with  . covid test    HPI Victor Monroe is a 34 y.o. male.   Pt is a 34 year old male with past medical history of perforated duodenal ulcer that presents with RUQ pain, cramping, N,V. This has been constant x 2 days. Vomiting after eating.  No bloody vomit.  Admits to eating mostly greasy and fried foods. Had had similar episodes to this in the past.  Morning last bowel movement was this morning and normal.  Denies any constipation.  Denies any fever, chills, body aches or night sweats.  Denies any respiratory symptoms.  Was also requesting Covid testing.  Reports to occasional drinking but denies any daily or heavy alcohol use.  Does have a past medical history of polysubstance abuse.    ROS per HPI      Past Medical History:  Diagnosis Date  . Polysubstance abuse [facility])     Patient Active Problem List   Diagnosis Date Noted  . 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 Perforated duodenal ulcer (HCC) 01/22/2015    Past Surgical History:  Procedure Laterality Date  . ABDOMINAL SURGERY    . LAPAROTOMY N/A 01/22/2015   Procedure: EXPLORATORY LAPAROTOMY,  GRAHAM PATCH CLOSURE OF PERFORATED DUODENAL ULCER;  Surgeon: Darnell Level, MD;  Location: [facility];  Service: General;  Laterality: N/A;       Home Medications    Prior to Admission medications   Medication Sig Start Date End Date Taking? Authorizing Provider  ibuprofen (ADVIL) 800 MG tablet Take 1 tablet (800 mg total) by mouth 3 (three) times daily. 05/12/19   Fayrene Helper, PA-C  omeprazole (PRILOSEC) 20 MG capsule Take 1 capsule (20 mg total) by mouth 2 (two) times daily before a meal for 14 days. 11/20/19 12/04/19  Wieters, Hallie C, PA-C  ondansetron (ZOFRAN ODT) 4 MG disintegrating tablet Take 1 tablet (4 mg total) by mouth every 8 (eight) hours as needed for nausea or vomiting.  01/01/20   Dahlia Byes A, NP  tiZANidine (ZANAFLEX) 4 MG tablet Take 1-2 tablets (4-8 mg total) by mouth every 6 (six) hours as needed for muscle spasms. 05/12/19   Fayrene Helper, PA-C    Family History Family History  Problem Relation Age of Onset  . Supraventricular tachycardia Mother   . Healthy Father     Social History Social History   Tobacco Use  . Smoking status: Current Every Day Smoker    Packs/day: 0.50    Years: 20.00    Pack years: 10.00    Types: Cigarettes  . Smokeless tobacco: Never Used  Substance Use Topics  . Alcohol use: Yes    Alcohol/week: 3.0 standard drinks    Types: 3 Glasses of wine per week    Comment: occassionally  . Drug use: Yes    Types: Marijuana    Comment: Pt reports he used to do cocaine but quit     Allergies   Patient has no known allergies.   Review of Systems Review of Systems   Physical Exam Triage Vital Signs ED Triage Vitals  Enc Vitals Group     BP 01/01/20 1508 140/90     Pulse Rate 01/01/20 1508 97     Resp 01/01/20 1508 18     Temp 01/01/20 1508 98.2 F (36.8 C)     Temp Source  01/01/20 1508 Oral     SpO2 01/01/20 1508 98 %     Weight 01/01/20 1505 (!) 330 lb (149.7 kg)     Height --      Head Circumference --      Peak Flow --      Pain Score 01/01/20 1625 5     Pain Loc --      Pain Edu? --      Excl. in [place]? --    No data found.  Updated Vital Signs BP 140/90 (BP Location: Right Arm)   Pulse 97   Temp 98.2 F (36.8 C) (Oral)   Resp 18   Wt (!) 330 lb (149.7 kg)   SpO2 98%   BMI 48.73 kg/m   Visual Acuity Right Eye Distance:   Left Eye Distance:   Bilateral Distance:    Right Eye Near:   Left Eye Near:    Bilateral Near:     Physical Exam Vitals reviewed.  Constitutional:      General: He is not in acute distress.    Appearance: He is not ill-appearing, toxic-appearing or diaphoretic.  HENT:     Head: Normocephalic and atraumatic.  Cardiovascular:     Rate and Rhythm: Normal rate and  regular rhythm.     Pulses: Normal pulses.     Heart sounds: Normal heart sounds.  Pulmonary:     Effort: Pulmonary effort is normal.     Breath sounds: Normal breath sounds.  Abdominal:     General: Abdomen is protuberant. Bowel sounds are normal. There is no distension.     Palpations: Abdomen is soft. There is hepatomegaly.     Tenderness: There is abdominal tenderness in the right upper quadrant. There is no right CVA tenderness or left CVA tenderness.     Hernia: No hernia is present.  Musculoskeletal:        General: Normal range of motion.     Cervical back: Normal range of motion.  Skin:    General: Skin is warm and dry.  Neurological:     General: No focal deficit present.     Mental Status: He is alert.  Psychiatric:        Mood and Affect: Mood normal.      UC Treatments / Results  Labs (all labs ordered are listed, but only abnormal results are displayed) Labs Reviewed  CBC WITH DIFFERENTIAL/PLATELET - Abnormal; Notable for the following components:      Result Value   WBC 15.0 (*)    Neutro Abs 9.8 (*)    Abs Immature Granulocytes 0.10 (*)    All other components within normal limits  COMPREHENSIVE METABOLIC PANEL - Abnormal; Notable for the following components:   Glucose, Bld 108 (*)    Creatinine, Ser 1.28 (*)    AST 54 (*)    ALT 50 (*)    Anion gap 16 (*)    All other components within normal limits  SARS CORONAVIRUS 2 (TAT 6-24 HRS)  LIPASE, BLOOD  AMYLASE    EKG   Radiology No results found.  Procedures Procedures (including critical care time)  Medications Ordered in UC Medications - No data to display  Initial Impression / Assessment and Plan / UC Course  I have reviewed the triage vital signs and the nursing notes.  Pertinent labs & imaging results that were available during my care of the patient were reviewed by me and considered in my medical decision making (see chart for  details).  Clinical Course as of Jan 01 937  Wed Jan 02, 2020  0826 Abs Immature Granulocytes(!): 0.10 [TB]  0826 AST(!): 54 [TB]  0929 ALT(!): 50 [TB]  0929 Anion gap(!): 16 [TB]  0929 Creatinine(!): 1.28 [TB]  0929 Glucose(!): 108 [TB]  0929 WBC(!): 15.0 [TB]  0929 NEUT#(!): 9.8 [TB]  0929 Abs Immature Granulocytes(!): 0.10 [TB]    Clinical Course User Index [TB] Janace Aris, NP    34 year old male with right upper quadrant discomfort, nausea and vomiting.  Described as cramping in the right upper quadrant area that comes and goes. Tenderness to palpation of the right upper quadrant with what appears to be mild hepatomegaly CMP with elevated AST and ALT, mildly elevated creatinine and mildly elevated anion gap. No obvious jaundice  White blood cell count 15 with 9.8 neutrophils 1.10 immature granulocytes Based on exam and lab work there is concern for cholecystitis. He may have fatty liver.  Although looking back at previous CBC patient has had elevated white blood cell count around 15-14 on numerous occasions. He needs a CT scan or ultrasound of the abdomen.  Will call pt and relay this information.  reccommended he goes to the ER for imaging.  He has no primary care or insurance.    Final Clinical Impressions(s) / UC Diagnoses   Final diagnoses:  Encounter for laboratory testing for COVID-19 virus  RUQ pain  Nausea and vomiting, intractability of vomiting not specified, unspecified vomiting type     Discharge Instructions     Covid swab sent for testing  We should have the results in 6-24 hours.  I am concerned this may be a gallbladder issue  I am drawing some lab work If your pain worsens you need to go to the ER.  Zofran as needed for nausea and vomiting.      ED Prescriptions    Medication Sig Dispense Auth. Provider   ondansetron (ZOFRAN ODT) 4 MG disintegrating tablet Take 1 tablet (4 mg total) by mouth every 8 (eight) hours as needed for nausea or vomiting. 20 tablet Dahlia Byes A, NP     PDMP not  reviewed this encounter.   Janace Aris, NP 01/02/20 [id number]

## 2020-03-04 ENCOUNTER — Encounter (HOSPITAL_COMMUNITY): Payer: Self-pay

## 2020-03-04 ENCOUNTER — Ambulatory Visit (INDEPENDENT_AMBULATORY_CARE_PROVIDER_SITE_OTHER): Payer: Self-pay

## 2020-03-04 ENCOUNTER — Other Ambulatory Visit: Payer: Self-pay

## 2020-03-04 ENCOUNTER — Ambulatory Visit (HOSPITAL_COMMUNITY)
Admission: EM | Admit: 2020-03-04 | Discharge: 2020-03-04 | Disposition: A | Discharge status: Home / Self Care | Payer: Self-pay

## 2020-03-04 DIAGNOSIS — M25572 Pain in left ankle and joints of left foot: Secondary | ICD-10-CM

## 2020-03-04 MED ORDER — PREDNISONE 10 MG (21) PO TBPK: ORAL_TABLET | ORAL | 0 refills | Status: DC

## 2020-03-04 NOTE — Discharge Instructions (Signed)
Your x ray was normal Treating you for achilles tendonitis  Take the prednisone as prescribed Keep resting, icing elevating.  Cam walker given here.  Follow up with sports medicine as needed.

## 2020-03-04 NOTE — ED Triage Notes (Signed)
Pt reports left ankle pain x 2 weeks, not know trauma. Pt tried ankle brace, soak in hot water without relief.

## 2020-03-04 NOTE — ED Provider Notes (Signed)
[facility]    CSN: [id number] Arrival date & time: 03/04/20  1107      History   Chief Complaint Chief Complaint  Patient presents with  . Ankle Pain    HPI Victor Monroe is a 34 y.o. male.   Patient is a 34 year old male that presents today with approximately 2 weeks of left ankle pain in the Achilles area.  Symptoms have been pretty constant, waxing waning.  Worse with ambulation.  Denies any injuries or increased walking or running.  He has been resting, icing, soaking in hot water and taking ibuprofen without much relief.  There is mild swelling.  No deformities noted.  ROS per HPI      Past Medical History:  Diagnosis Date  . Polysubstance abuse [facility])     Patient Active Problem List   Diagnosis Date Noted  . 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 Perforated duodenal ulcer (HCC) 01/22/2015    Past Surgical History:  Procedure Laterality Date  . ABDOMINAL SURGERY    . LAPAROTOMY N/A 01/22/2015   Procedure: EXPLORATORY LAPAROTOMY,  GRAHAM PATCH CLOSURE OF PERFORATED DUODENAL ULCER;  Surgeon: Darnell Level, MD;  Location: [facility];  Service: General;  Laterality: N/A;       Home Medications    Prior to Admission medications   Medication Sig Start Date End Date Taking? Authorizing Provider  acetaminophen (TYLENOL) 500 MG tablet Take 500 mg by mouth every 6 (six) hours as needed.   Yes [provider]  predniSONE (STERAPRED UNI-PAK 21 TAB) 10 MG (21) TBPK tablet 6 tabs for 1 day, then 5 tabs for 1 das, then 4 tabs for 1 day, then 3 tabs for 1 day, 2 tabs for 1 day, then 1 tab for 1 day 03/04/20   Dahlia Byes A, NP  omeprazole (PRILOSEC) 20 MG capsule Take 1 capsule (20 mg total) by mouth 2 (two) times daily before a meal for 14 days. 11/20/19 03/04/20  Wieters, Junius Creamer, PA-C    Family History Family History  Problem Relation Age of Onset  . Supraventricular tachycardia Mother   . Healthy Father     Social History Social History   Tobacco  Use  . Smoking status: Current Every Day Smoker    Packs/day: 0.50    Years: 20.00    Pack years: 10.00    Types: Cigarettes  . Smokeless tobacco: Never Used  Substance Use Topics  . Alcohol use: Yes    Alcohol/week: 3.0 standard drinks    Types: 3 Glasses of wine per week    Comment: occassionally  . Drug use: Yes    Types: Marijuana    Comment: Pt reports he used to do cocaine but quit     Allergies   Patient has no known allergies.   Review of Systems Review of Systems   Physical Exam Triage Vital Signs ED Triage Vitals  Enc Vitals Group     BP 03/04/20 1155 (!) 139/96     Pulse Rate 03/04/20 1155 78     Resp 03/04/20 1155 20     Temp --      Temp src --      SpO2 03/04/20 1155 96 %     Weight --      Height --      Head Circumference --      Peak Flow --      Pain Score 03/04/20 1156 8     Pain Loc --  Pain Edu? --      Excl. in [place]? --    No data found.  Updated Vital Signs BP (!) 139/96 (BP Location: Right Wrist)   Pulse 78   Resp 20   SpO2 96%   Visual Acuity Right Eye Distance:   Left Eye Distance:   Bilateral Distance:    Right Eye Near:   Left Eye Near:    Bilateral Near:     Physical Exam Vitals and nursing note reviewed.  Constitutional:      Appearance: Normal appearance.  HENT:     Head: Normocephalic and atraumatic.     Nose: Nose normal.  Eyes:     Conjunctiva/sclera: Conjunctivae normal.  Pulmonary:     Effort: Pulmonary effort is normal.  Musculoskeletal:        General: Normal range of motion.     Cervical back: Normal range of motion.     Left ankle: Swelling present. Tenderness present. Normal range of motion.     Left Achilles Tendon: Tenderness present. No defects. Thompson's test negative.  Skin:    General: Skin is warm and dry.  Neurological:     Mental Status: He is alert.  Psychiatric:        Mood and Affect: Mood normal.      UC Treatments / Results  Labs (all labs ordered are listed, but only  abnormal results are displayed) Labs Reviewed - No data to display  EKG   Radiology DG Ankle Complete Left  Result Date: 03/04/2020 CLINICAL DATA:  Left ankle pain and swelling for 3 weeks common no known injury, initial encounter EXAM: LEFT ANKLE COMPLETE - 3+ VIEW COMPARISON:  None. FINDINGS: There is no evidence of fracture, dislocation, or joint effusion. There is no evidence of arthropathy or other focal bone abnormality. Soft tissues are unremarkable. IMPRESSION: No acute abnormality noted. Electronically Signed   By: Alcide Clever M.D.   On: 03/04/2020 12:49    Procedures Procedures (including critical care time)  Medications Ordered in UC Medications - No data to display  Initial Impression / Assessment and Plan / UC Course  I have reviewed the triage vital signs and the nursing notes.  Pertinent labs & imaging results that were available during my care of the patient were reviewed by me and considered in my medical decision making (see chart for details).     Left ankle pain.   X-ray without any findings. Most likely Achilles tendinitis based on symptoms, exam We will treat with prednisone taper over the next 6 days. Recommend keep icing, elevating and will have him use cam walker boot for better support. A few days off work given for rest Follow up as needed for continued or worsening symptoms  Final Clinical Impressions(s) / UC Diagnoses   Final diagnoses:  Acute left ankle pain     Discharge Instructions     Your x ray was normal Treating you for achilles tendonitis  Take the prednisone as prescribed Keep resting, icing elevating.  Cam walker given here.  Follow up with sports medicine as needed.     ED Prescriptions    Medication Sig Dispense Auth. Provider   predniSONE (STERAPRED UNI-PAK 21 TAB) 10 MG (21) TBPK tablet 6 tabs for 1 day, then 5 tabs for 1 das, then 4 tabs for 1 day, then 3 tabs for 1 day, 2 tabs for 1 day, then 1 tab for 1 day 21 tablet  Dahlia Byes A, NP  PDMP not reviewed this encounter.   Orvan July, NP 03/04/20 1355

## 2020-06-28 ENCOUNTER — Encounter (HOSPITAL_COMMUNITY): Payer: Self-pay | Admitting: Emergency Medicine

## 2020-06-28 ENCOUNTER — Ambulatory Visit (HOSPITAL_COMMUNITY)
Admission: EM | Admit: 2020-06-28 | Discharge: 2020-06-28 | Disposition: A | Discharge status: Home / Self Care | Payer: HRSA Program | Attending: Family Medicine | Admitting: Family Medicine

## 2020-06-28 DIAGNOSIS — F17210 Nicotine dependence, cigarettes, uncomplicated: Secondary | ICD-10-CM | POA: Diagnosis not present

## 2020-06-28 DIAGNOSIS — Z20822 Contact with and (suspected) exposure to covid-19: Secondary | ICD-10-CM | POA: Insufficient documentation

## 2020-06-28 DIAGNOSIS — R112 Nausea with vomiting, unspecified: Secondary | ICD-10-CM | POA: Diagnosis not present

## 2020-06-28 DIAGNOSIS — R05 Cough: Secondary | ICD-10-CM | POA: Insufficient documentation

## 2020-06-28 DIAGNOSIS — R197 Diarrhea, unspecified: Secondary | ICD-10-CM | POA: Diagnosis present

## 2020-06-28 DIAGNOSIS — J069 Acute upper respiratory infection, unspecified: Secondary | ICD-10-CM | POA: Diagnosis not present

## 2020-06-28 LAB — SARS CORONAVIRUS 2 (TAT 6-24 HRS): SARS Coronavirus 2: NEGATIVE

## 2020-06-28 MED ORDER — ONDANSETRON 4 MG PO TBDP: 4.0000 mg | ORAL_TABLET | Freq: Three times a day (TID) | ORAL | 0 refills | Status: DC | PRN

## 2020-06-28 MED ORDER — BENZONATATE 200 MG PO CAPS: 200.0000 mg | ORAL_CAPSULE | Freq: Three times a day (TID) | ORAL | 0 refills | Status: AC | PRN

## 2020-06-28 NOTE — Discharge Instructions (Signed)
Covid test pending, monitor my chart for results Benzonatate/Tessalon as needed for cough Zofran/ondansetron for nausea/vomiting-dissolves in mouth Push fluids Over-the-counter Imodium/Pepto-Bismol for diarrhea Follow-up if not improving or worsening

## 2020-06-28 NOTE — ED Triage Notes (Signed)
Pt presents with N,V,D, loss of taste xs 2 days. States was exposed at work.

## 2020-06-28 NOTE — ED Provider Notes (Signed)
[facility]    CSN: [id number] Arrival date & time: 06/28/20  1042      History   Chief Complaint Chief Complaint  Patient presents with  . Diarrhea    HPI Victor Monroe is a 34 y.o. male presenting today for diarrhea and cough. Reports close exposure at work. Symptoms began 2 days ago.  Denies associate abdominal pain.  Denies chest pain or shortness of breath.  Denies blood in stool or vomit.  HPI  Past Medical History:  Diagnosis Date  . Polysubstance abuse [facility])     Patient Active Problem List   Diagnosis Date Noted  . 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 Perforated duodenal ulcer (HCC) 01/22/2015    Past Surgical History:  Procedure Laterality Date  . ABDOMINAL SURGERY    . LAPAROTOMY N/A 01/22/2015   Procedure: EXPLORATORY LAPAROTOMY,  GRAHAM PATCH CLOSURE OF PERFORATED DUODENAL ULCER;  Surgeon: Darnell Level, MD;  Location: [facility];  Service: General;  Laterality: N/A;       Home Medications    Prior to Admission medications   Medication Sig Start Date End Date Taking? Authorizing Provider  acetaminophen (TYLENOL) 500 MG tablet Take 500 mg by mouth every 6 (six) hours as needed.    [provider]  benzonatate (TESSALON) 200 MG capsule Take 1 capsule (200 mg total) by mouth 3 (three) times daily as needed for up to 7 days for cough. 06/28/20 07/05/20  Loreta Blouch C, PA-C  ondansetron (ZOFRAN ODT) 4 MG disintegrating tablet Take 1 tablet (4 mg total) by mouth every 8 (eight) hours as needed for nausea or vomiting. 06/28/20   Donovan Gatchel C, PA-C  predniSONE (STERAPRED UNI-PAK 21 TAB) 10 MG (21) TBPK tablet 6 tabs for 1 day, then 5 tabs for 1 das, then 4 tabs for 1 day, then 3 tabs for 1 day, 2 tabs for 1 day, then 1 tab for 1 day 03/04/20   Dahlia Byes A, NP  omeprazole (PRILOSEC) 20 MG capsule Take 1 capsule (20 mg total) by mouth 2 (two) times daily before a meal for 14 days. 11/20/19 03/04/20  Jeanette Moffatt, Junius Creamer, PA-C    Family  History Family History  Problem Relation Age of Onset  . Supraventricular tachycardia Mother   . Healthy Father     Social History Social History   Tobacco Use  . Smoking status: Current Every Day Smoker    Packs/day: 0.50    Years: 20.00    Pack years: 10.00    Types: Cigarettes  . Smokeless tobacco: Never Used  Vaping Use  . Vaping Use: Never used  Substance Use Topics  . Alcohol use: Yes    Alcohol/week: 3.0 standard drinks    Types: 3 Glasses of wine per week    Comment: occassionally  . Drug use: Yes    Types: Marijuana    Comment: Pt reports he used to do cocaine but quit     Allergies   Patient has no known allergies.   Review of Systems Review of Systems  Constitutional: Positive for appetite change. Negative for activity change, chills, fatigue and fever.  HENT: Positive for congestion and rhinorrhea. Negative for ear pain, sinus pressure, sore throat and trouble swallowing.   Eyes: Negative for discharge and redness.  Respiratory: Positive for cough. Negative for chest tightness and shortness of breath.   Cardiovascular: Negative for chest pain.  Gastrointestinal: Positive for diarrhea, nausea and vomiting. Negative for abdominal pain.  Musculoskeletal: Negative for myalgias.  Skin: Negative for rash.  Neurological: Negative for dizziness, light-headedness and headaches.     Physical Exam Triage Vital Signs ED Triage Vitals  Enc Vitals Group     BP 06/28/20 1146 (!) 169/114     Pulse Rate 06/28/20 1146 77     Resp 06/28/20 1146 18     Temp 06/28/20 1146 98.2 F (36.8 C)     Temp Source 06/28/20 1146 Oral     SpO2 06/28/20 1146 98 %     Weight --      Height --      Head Circumference --      Peak Flow --      Pain Score 06/28/20 1145 0     Pain Loc --      Pain Edu? --      Excl. in GC? --    No data found.  Updated Vital Signs BP (!) 169/114 (BP Location: Right Wrist)   Pulse 77   Temp 98.2 F (36.8 C) (Oral)   Resp 18   SpO2 98%    Visual Acuity Right Eye Distance:   Left Eye Distance:   Bilateral Distance:    Right Eye Near:   Left Eye Near:    Bilateral Near:     Physical Exam Vitals and nursing note reviewed.  Constitutional:      Appearance: He is well-developed.     Comments: No acute distress  HENT:     Head: Normocephalic and atraumatic.     Ears:     Comments: Bilateral ears without tenderness to palpation of external auricle, tragus and mastoid, EAC's without erythema or swelling, TM's with good bony landmarks and cone of light. Non erythematous.     Nose: Nose normal.     Mouth/Throat:     Comments: Oral mucosa pink and moist, no tonsillar enlargement or exudate. Posterior pharynx patent and nonerythematous, no uvula deviation or swelling. Normal phonation. Eyes:     Conjunctiva/sclera: Conjunctivae normal.  Cardiovascular:     Rate and Rhythm: Normal rate.  Pulmonary:     Effort: Pulmonary effort is normal. No respiratory distress.     Comments: Breathing comfortably at rest, CTABL, no wheezing, rales or other adventitious sounds auscultated Abdominal:     General: There is no distension.     Comments: Soft, nondistended, nontender  To touch   Musculoskeletal:        General: Normal range of motion.     Cervical back: Neck supple.  Skin:    General: Skin is warm and dry.  Neurological:     Mental Status: He is alert and oriented to person, place, and time.      UC Treatments / Results  Labs (all labs ordered are listed, but only abnormal results are displayed) Labs Reviewed  SARS CORONAVIRUS 2 (TAT 6-24 HRS)    EKG   Radiology No results found.  Procedures Procedures (including critical care time)  Medications Ordered in UC Medications - No data to display  Initial Impression / Assessment and Plan / UC Course  I have reviewed the triage vital signs and the nursing notes.  Pertinent labs & imaging results that were available during my care of the patient were  reviewed by me and considered in my medical decision making (see chart for details).     Covid PCR pending, cough and GI symptoms x2 days.  Suspect likely viral etiology and recommending symptomatic and supportive care with continued monitoring.  No abdominal tenderness,  negative peritoneal signs.  Discussed strict return precautions. Patient verbalized understanding and is agreeable with plan.  Final Clinical Impressions(s) / UC Diagnoses   Final diagnoses:  Nausea vomiting and diarrhea  Viral URI with cough     Discharge Instructions     Covid test pending, monitor my chart for results Benzonatate/Tessalon as needed for cough Zofran/ondansetron for nausea/vomiting-dissolves in mouth Push fluids Over-the-counter Imodium/Pepto-Bismol for diarrhea Follow-up if not improving or worsening    ED Prescriptions    Medication Sig Dispense Auth. Provider   benzonatate (TESSALON) 200 MG capsule Take 1 capsule (200 mg total) by mouth 3 (three) times daily as needed for up to 7 days for cough. 28 capsule Khaylee Mcevoy C, PA-C   ondansetron (ZOFRAN ODT) 4 MG disintegrating tablet Take 1 tablet (4 mg total) by mouth every 8 (eight) hours as needed for nausea or vomiting. 20 tablet Pauletta Pickney, [place] C, PA-C     PDMP not reviewed this encounter.   Lew Dawes, [place] 06/30/20 1900

## 2020-07-02 IMAGING — DX DG ANKLE COMPLETE 3+V*L*
3 series · 3 of 3 positions shown · non-contrast
Comparison: None.

CLINICAL DATA: Left ankle pain and swelling for 3 weeks common no
known injury, initial encounter

EXAM:
LEFT ANKLE COMPLETE - 3+ VIEW

[ankle ap]
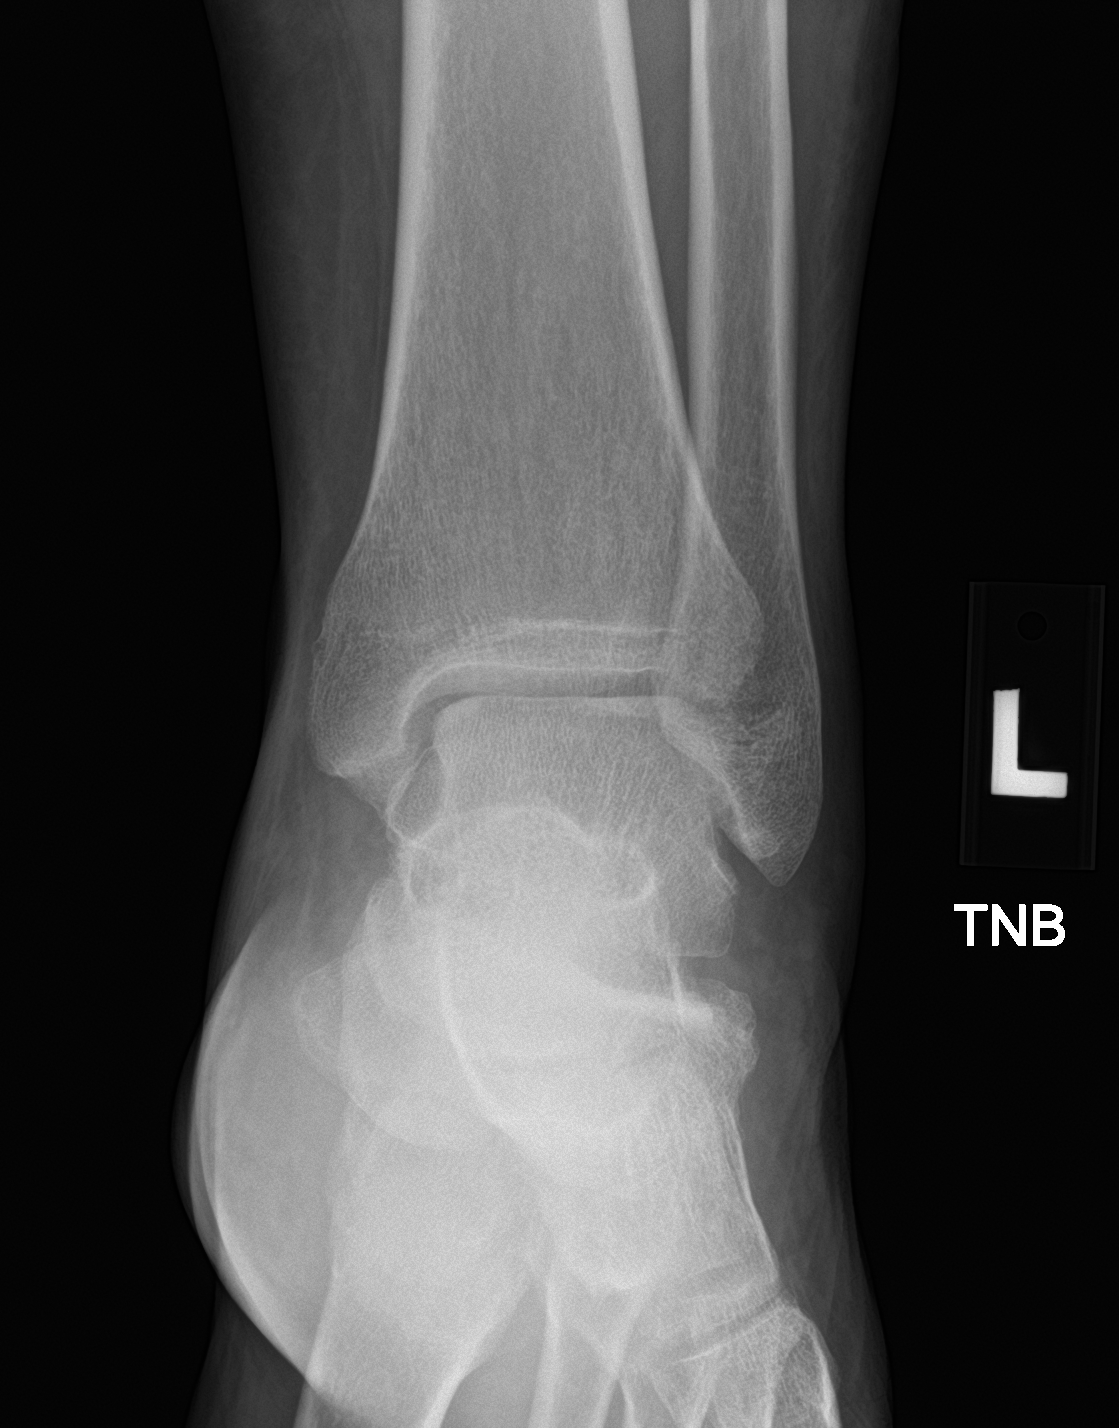

[ankle obl]
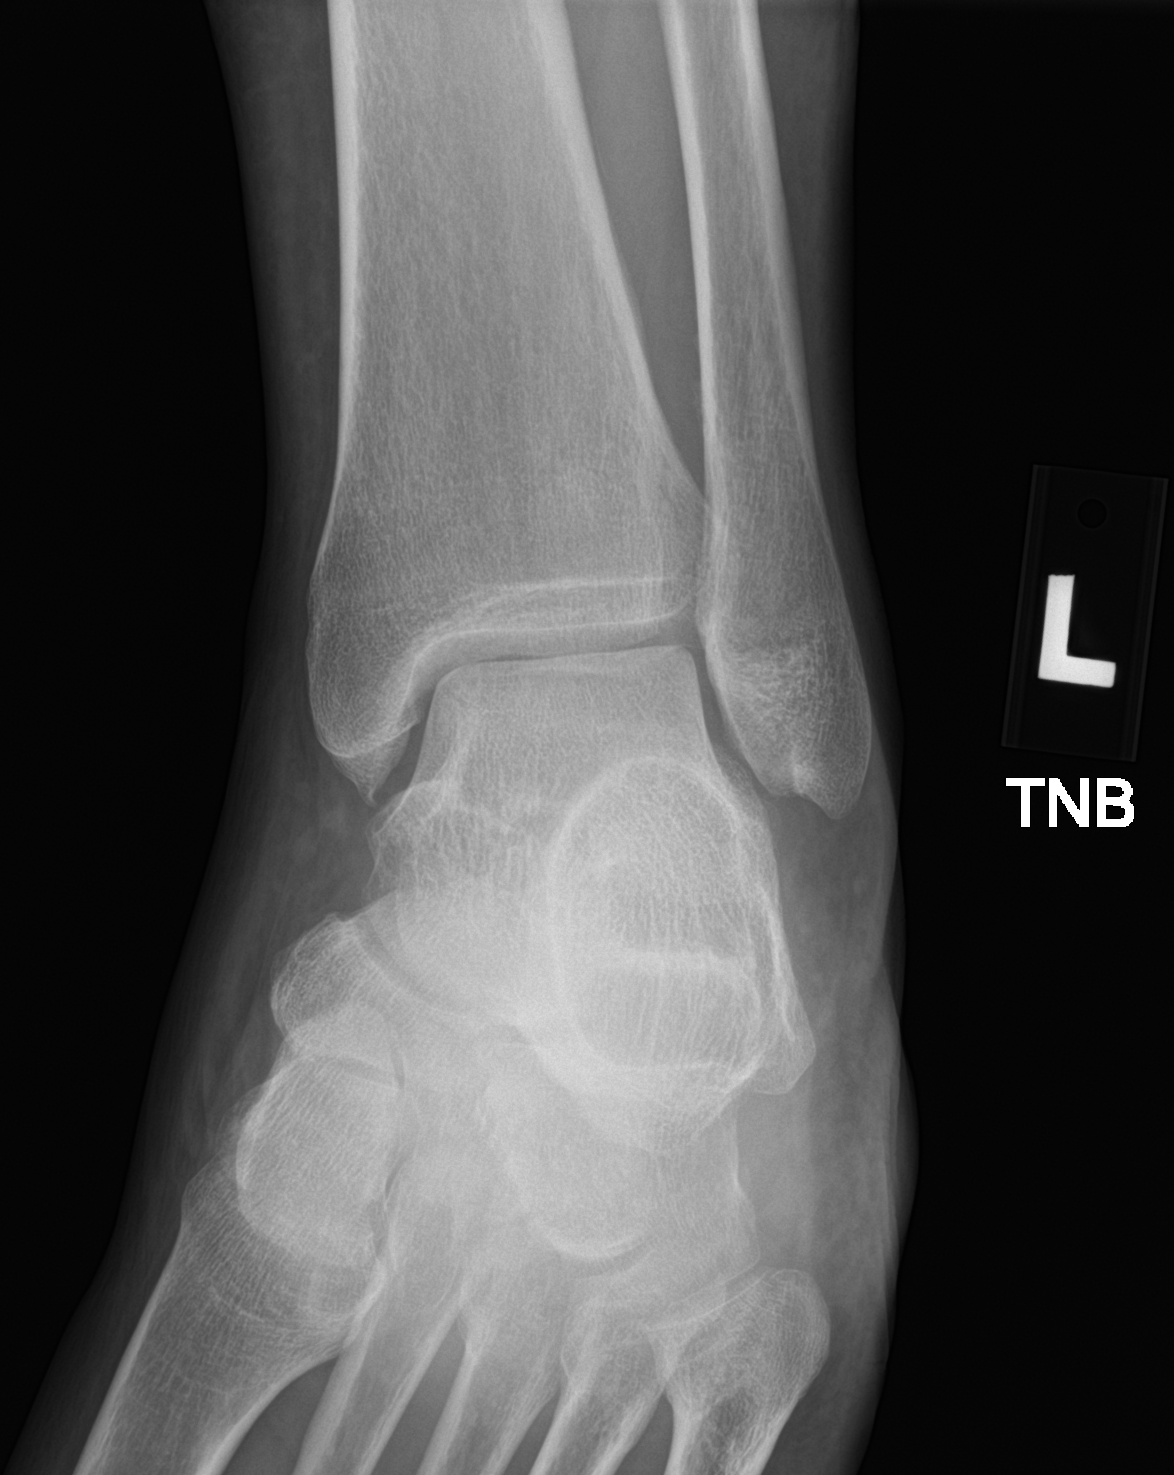

[ankle lat]
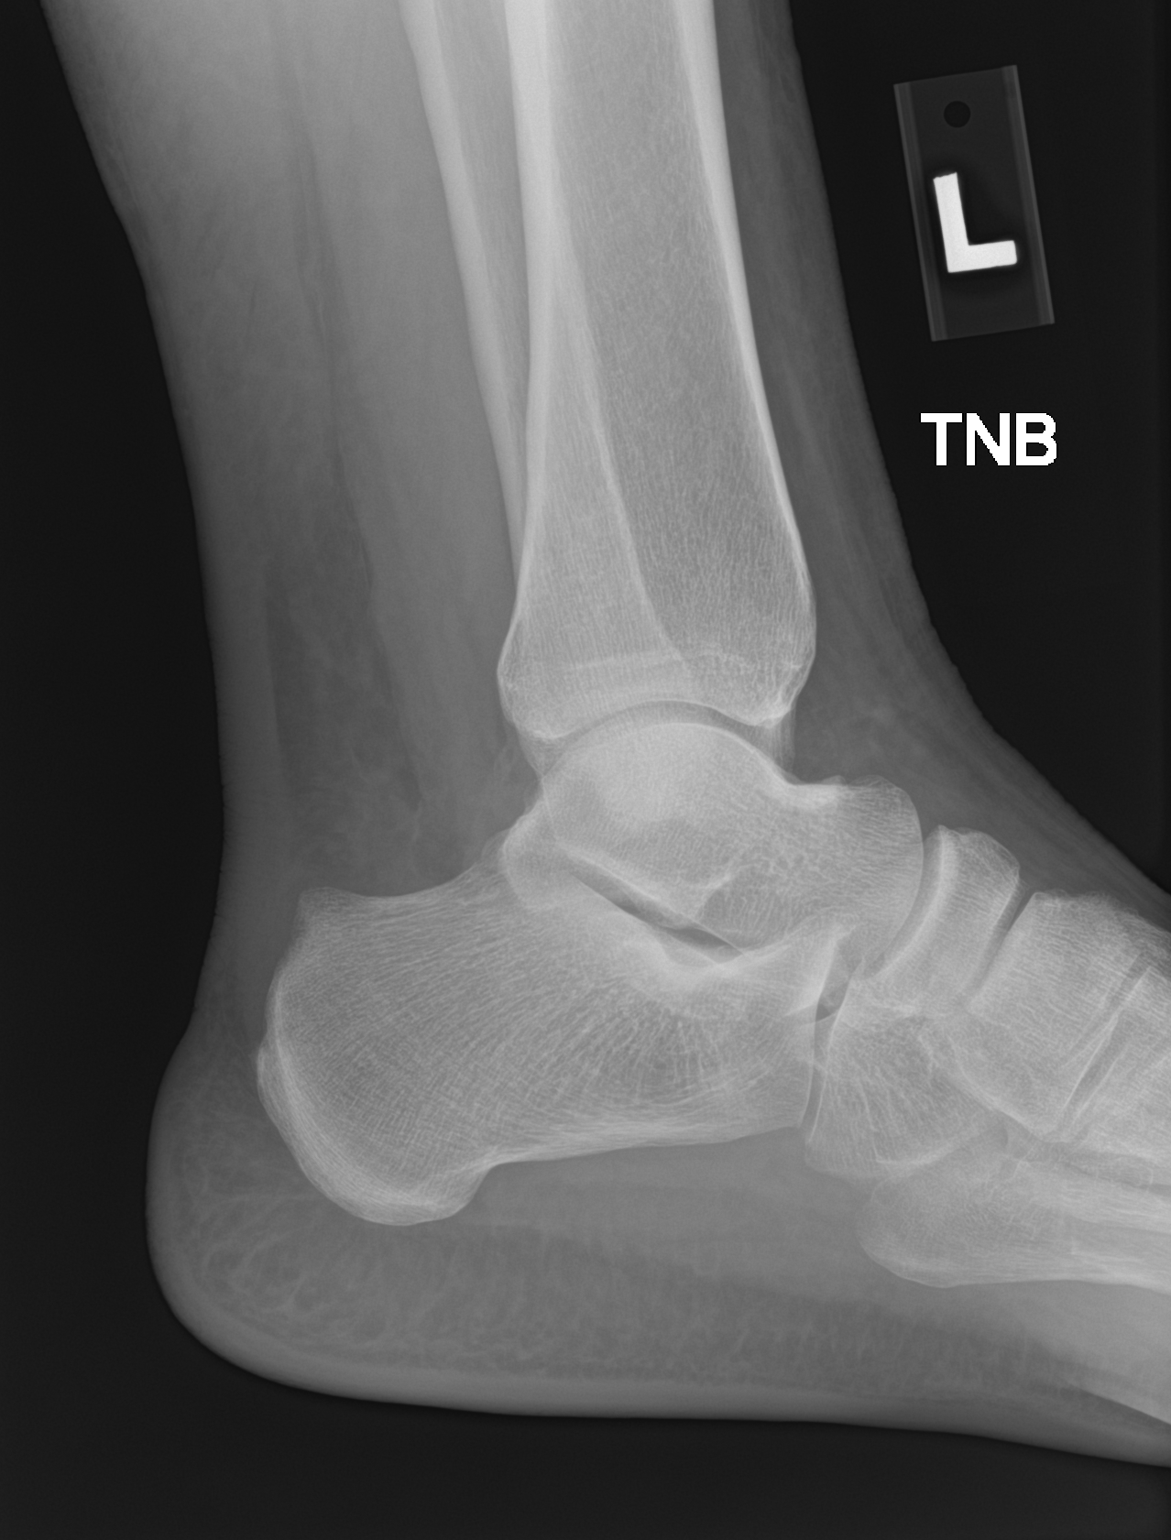

[3 of 3 positions shown; findings below may reference images not displayed]

FINDINGS: There is no evidence of fracture, dislocation, or joint effusion.
There is no evidence of arthropathy or other focal bone abnormality.
Soft tissues are unremarkable.
IMPRESSION: No acute abnormality noted.

## 2021-02-25 ENCOUNTER — Other Ambulatory Visit: Payer: Self-pay

## 2021-02-25 ENCOUNTER — Encounter (HOSPITAL_COMMUNITY): Payer: Self-pay

## 2021-02-25 ENCOUNTER — Ambulatory Visit (HOSPITAL_COMMUNITY)
Admission: EM | Admit: 2021-02-25 | Discharge: 2021-02-25 | Disposition: A | Discharge status: Home / Self Care | Payer: Self-pay | Attending: Medical Oncology | Admitting: Medical Oncology

## 2021-02-25 DIAGNOSIS — M7021 Olecranon bursitis, right elbow: Secondary | ICD-10-CM

## 2021-02-25 DIAGNOSIS — M62838 Other muscle spasm: Secondary | ICD-10-CM

## 2021-02-25 MED ORDER — MELOXICAM 15 MG PO TABS: 15.0000 mg | ORAL_TABLET | Freq: Every day | ORAL | 0 refills | Status: DC

## 2021-02-25 MED ORDER — METHOCARBAMOL 500 MG PO TABS: 500.0000 mg | ORAL_TABLET | Freq: Two times a day (BID) | ORAL | 0 refills | Status: DC

## 2021-02-25 NOTE — ED Provider Notes (Signed)
[facility]    CSN: [id number] Arrival date & time: 02/25/21  2000      History   Chief Complaint Chief Complaint  Patient presents with  . Arm Pain    HPI Victor Monroe is a 35 y.o. male.   HPI   Arm Pain: Patient reports that he has had left elbow pain for the past 1 week.  He states that this causes some limited range of motion for him is when he fully extends the elbow causes pain.  Pain is located just above the elbow.  No known injury.  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  He has not tried anything for symptoms.  No loss of grip strength, edema or skin color changes.  He also notes that he has had some left side pain when he twists.  Feels like an ache type pain that is annoying in nature.  He has not taken anything for this either.  No known rash.  Past Medical History:  Diagnosis Date  . Polysubstance abuse [facility])     Patient Active Problem List   Diagnosis Date Noted  . 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 Perforated duodenal ulcer (HCC) 01/22/2015    Past Surgical History:  Procedure Laterality Date  . ABDOMINAL SURGERY    . LAPAROTOMY N/A 01/22/2015   Procedure: EXPLORATORY LAPAROTOMY,  GRAHAM PATCH CLOSURE OF PERFORATED DUODENAL ULCER;  Surgeon: Darnell Level, MD;  Location: [facility];  Service: General;  Laterality: N/A;       Home Medications    Prior to Admission medications   Medication Sig Start Date End Date Taking? Authorizing Provider  acetaminophen (TYLENOL) 500 MG tablet Take 500 mg by mouth every 6 (six) hours as needed.    [provider]  ondansetron (ZOFRAN ODT) 4 MG disintegrating tablet Take 1 tablet (4 mg total) by mouth every 8 (eight) hours as needed for nausea or vomiting. 06/28/20   Wieters, Hallie C, PA-C  predniSONE (STERAPRED UNI-PAK 21 TAB) 10 MG (21) TBPK tablet 6 tabs for 1 day, then 5 tabs for 1 das, then 4 tabs for 1 day, then 3 tabs for 1 day, 2 tabs for 1 day, then 1 tab for 1 day 03/04/20   Dahlia Byes A,  NP  omeprazole (PRILOSEC) 20 MG capsule Take 1 capsule (20 mg total) by mouth 2 (two) times daily before a meal for 14 days. 11/20/19 03/04/20  Wieters, Junius Creamer, PA-C    Family History Family History  Problem Relation Age of Onset  . Supraventricular tachycardia Mother   . Healthy Father     Social History Social History   Tobacco Use  . Smoking status: Current Every Day Smoker    Packs/day: 0.50    Years: 20.00    Pack years: 10.00    Types: Cigarettes  . Smokeless tobacco: Never Used  Vaping Use  . Vaping Use: Never used  Substance Use Topics  . Alcohol use: Yes    Alcohol/week: 3.0 standard drinks    Types: 3 Glasses of wine per week    Comment: occassionally  . Drug use: Yes    Types: Marijuana    Comment: Pt reports he used to do cocaine but quit     Allergies   Patient has no known allergies.   Review of Systems Review of Systems  As stated above in HPI Physical Exam Triage Vital Signs ED Triage Vitals  Enc Vitals Group     BP 02/25/21 2010 Marland Kitchen)  144/96     Pulse Rate 02/25/21 2010 86     Resp 02/25/21 2010 16     Temp 02/25/21 2010 97.7 F (36.5 C)     Temp Source 02/25/21 2010 Oral     SpO2 02/25/21 2010 96 %     Weight --      Height --      Head Circumference --      Peak Flow --      Pain Score 02/25/21 2008 7     Pain Loc --      Pain Edu? --      Excl. in GC? --    No data found.  Updated Vital Signs BP (!) 144/96 (BP Location: Right Arm)   Pulse 86   Temp 97.7 F (36.5 C) (Oral)   Resp 16   SpO2 96%   Physical Exam Vitals and nursing note reviewed.  Constitutional:      General: He is not in acute distress.    Appearance: He is obese. He is not ill-appearing, toxic-appearing or diaphoretic.  Cardiovascular:     Pulses: Normal pulses.  Musculoskeletal:     Comments: No midline tenderness of spine.  Normal range of motion of back.  Normal range of motion of the right elbow, shoulder and arm.  There is tenderness to palpation of  the right olecranon bursa.  There is no edema, erythema, bruising or skin breakdown.  There are palpable muscle spasms which are reproducible of the left mid back and side.   Skin:    Capillary Refill: Capillary refill takes less than 2 seconds.     Findings: No rash.  Neurological:     Mental Status: He is alert and oriented to person, place, and time.     Sensory: No sensory deficit.     Motor: No weakness.      UC Treatments / Results  Labs (all labs ordered are listed, but only abnormal results are displayed) Labs Reviewed - No data to display  EKG   Radiology No results found.  Procedures Procedures (including critical care time)  Medications Ordered in UC Medications - No data to display  Initial Impression / Assessment and Plan / UC Course  I have reviewed the triage vital signs and the nursing notes.  Pertinent labs & imaging results that were available during my care of the patient were reviewed by me and considered in my medical decision making (see chart for details).     New.  I am treating his muscle spasms with a muscle relaxer and heating pad and treating his olecranon bursitis with cold therapy and NSAIDs.  Discussed how to use along with common potential side effects and precautions.  Discussed that symptoms likely will not fully resolve for the next 1 to 2 weeks but should slowly improve with time.  Discussed a elbow strap which she may find beneficial for symptoms. Follow up PRN.  Final Clinical Impressions(s) / UC Diagnoses   Final diagnoses:  None   Discharge Instructions   None    ED Prescriptions    None     PDMP not reviewed this encounter.   Rushie Chestnut, [place] 02/25/21 2023

## 2021-02-25 NOTE — ED Triage Notes (Signed)
Pt c/o right arm pain. He presents with limited ROM. Pt states he has been having right side pain.

## 2021-12-18 ENCOUNTER — Other Ambulatory Visit: Payer: Self-pay

## 2021-12-18 ENCOUNTER — Encounter (HOSPITAL_COMMUNITY): Payer: Self-pay | Admitting: Emergency Medicine

## 2021-12-18 ENCOUNTER — Ambulatory Visit (HOSPITAL_COMMUNITY)
Admission: EM | Admit: 2021-12-18 | Discharge: 2021-12-18 | Disposition: A | Discharge status: Home / Self Care | Payer: Self-pay | Attending: Family Medicine | Admitting: Family Medicine

## 2021-12-18 DIAGNOSIS — R630 Anorexia: Secondary | ICD-10-CM

## 2021-12-18 DIAGNOSIS — R112 Nausea with vomiting, unspecified: Secondary | ICD-10-CM

## 2021-12-18 LAB — CBG MONITORING, ED: Glucose-Capillary: 113 mg/dL — ABNORMAL HIGH (ref 70–99)

## 2021-12-18 MED ORDER — ONDANSETRON 4 MG PO TBDP
ORAL_TABLET | ORAL | Status: AC
  Filled 2021-12-18: qty 1

## 2021-12-18 MED ORDER — ONDANSETRON HCL 4 MG PO TABS: 4.0000 mg | ORAL_TABLET | Freq: Three times a day (TID) | ORAL | 0 refills | Status: DC | PRN

## 2021-12-18 MED ORDER — ONDANSETRON 4 MG PO TBDP
4.0000 mg | ORAL_TABLET | Freq: Once | ORAL | Status: AC
  Administered 2021-12-18: 4 mg via ORAL

## 2021-12-18 NOTE — ED Triage Notes (Signed)
PT has been vomiting for 1 week, thought it was improving, but returned after eating today. Vomited x6 today. No diarrhea ?

## 2021-12-18 NOTE — Discharge Instructions (Addendum)
If the vomiting persists despite antinausea medication or if you are unable to hold fluids recommend evaluation of the emergency department.  If you develop any severe abdominal pain go immediately to the emergency department ?

## 2021-12-18 NOTE — ED Provider Notes (Incomplete)
[facility]    CSN: [id number] Arrival date & time: 12/18/21  1941      History   Chief Complaint Chief Complaint  Patient presents with   Emesis    HPI Emiliano Welshans is a 36 y.o. male.   HPI Patient with a history of a perforated duodenal ulcer and polysubstance abuse presents today with a 1 week history of persistent vomiting decreased appetite.  He reports today he resumed eating food and has been vomiting all day.  His blood pressure is elevated on arrival  Past Medical History:  Diagnosis Date   Polysubstance abuse [facility])     Patient Active Problem List   Diagnosis Date Noted   Perforated viscus 01/22/2015   Perforated duodenal ulcer (HCC) 01/22/2015    Past Surgical History:  Procedure Laterality Date   ABDOMINAL SURGERY     LAPAROTOMY N/A 01/22/2015   Procedure: EXPLORATORY LAPAROTOMY,  GRAHAM PATCH CLOSURE OF PERFORATED DUODENAL ULCER;  Surgeon: Darnell Level, MD;  Location: [facility];  Service: General;  Laterality: N/A;       Home Medications    Prior to Admission medications   Medication Sig Start Date End Date Taking? Authorizing Provider  acetaminophen (TYLENOL) 500 MG tablet Take 500 mg by mouth every 6 (six) hours as needed.    [provider]  meloxicam (MOBIC) 15 MG tablet Take 1 tablet (15 mg total) by mouth daily. 02/25/21   Rushie Chestnut, PA-C  methocarbamol (ROBAXIN) 500 MG tablet Take 1 tablet (500 mg total) by mouth 2 (two) times daily. 02/25/21   Rushie Chestnut, PA-C  ondansetron (ZOFRAN ODT) 4 MG disintegrating tablet Take 1 tablet (4 mg total) by mouth every 8 (eight) hours as needed for nausea or vomiting. 06/28/20   Wieters, Hallie C, PA-C  omeprazole (PRILOSEC) 20 MG capsule Take 1 capsule (20 mg total) by mouth 2 (two) times daily before a meal for 14 days. 11/20/19 03/04/20  Wieters, Junius Creamer, PA-C    Family History Family History  Problem Relation Age of Onset   Supraventricular tachycardia Mother     Healthy Father     Social History Social History   Tobacco Use   Smoking status: Every Day    Packs/day: 0.50    Years: 20.00    Pack years: 10.00    Types: Cigarettes   Smokeless tobacco: Never  Vaping Use   Vaping Use: Never used  Substance Use Topics   Alcohol use: Yes    Alcohol/week: 3.0 standard drinks    Types: 3 Glasses of wine per week    Comment: occassionally   Drug use: Yes    Types: Marijuana    Comment: Pt reports he used to do cocaine but quit     Allergies   Patient has no known allergies.   Review of Systems Review of Systems   Physical Exam Triage Vital Signs ED Triage Vitals [12/18/21 1957]  Enc Vitals Group     BP      Pulse      Resp      Temp      Temp src      SpO2      Weight      Height      Head Circumference      Peak Flow      Pain Score 3     Pain Loc      Pain Edu?      Excl. in GC?  No data found.  Updated Vital Signs There were no vitals taken for this visit.  Visual Acuity Right Eye Distance:   Left Eye Distance:   Bilateral Distance:    Right Eye Near:   Left Eye Near:    Bilateral Near:     Physical Exam   UC Treatments / Results  Labs (all labs ordered are listed, but only abnormal results are displayed) Labs Reviewed - No data to display  EKG   Radiology No results found.  Procedures Procedures (including critical care time)  Medications Ordered in UC Medications - No data to display  Initial Impression / Assessment and Plan / UC Course  I have reviewed the triage vital signs and the nursing notes.  Pertinent labs & imaging results that were available during my care of the patient were reviewed by me and considered in my medical decision making (see chart for details).     *** Final Clinical Impressions(s) / UC Diagnoses   Final diagnoses:  None   Discharge Instructions   None    ED Prescriptions   None    PDMP not reviewed this encounter.

## 2021-12-30 ENCOUNTER — Encounter (HOSPITAL_COMMUNITY): Payer: Self-pay | Admitting: *Deleted

## 2021-12-30 ENCOUNTER — Emergency Department (HOSPITAL_COMMUNITY): Payer: Self-pay

## 2021-12-30 ENCOUNTER — Emergency Department (HOSPITAL_COMMUNITY)
Admission: EM | Admit: 2021-12-30 | Discharge: 2021-12-30 | Disposition: A | Discharge status: Home / Self Care | Payer: Self-pay | Attending: Emergency Medicine | Admitting: Emergency Medicine

## 2021-12-30 ENCOUNTER — Other Ambulatory Visit: Payer: Self-pay

## 2021-12-30 DIAGNOSIS — D72829 Elevated white blood cell count, unspecified: Secondary | ICD-10-CM | POA: Insufficient documentation

## 2021-12-30 DIAGNOSIS — K279 Peptic ulcer, site unspecified, unspecified as acute or chronic, without hemorrhage or perforation: Secondary | ICD-10-CM | POA: Insufficient documentation

## 2021-12-30 DIAGNOSIS — Z20822 Contact with and (suspected) exposure to covid-19: Secondary | ICD-10-CM | POA: Insufficient documentation

## 2021-12-30 LAB — CBC
HCT: 49.6 % (ref 39.0–52.0)
Hemoglobin: 15.9 g/dL (ref 13.0–17.0)
MCH: 29.2 pg (ref 26.0–34.0)
MCHC: 32.1 g/dL (ref 30.0–36.0)
MCV: 91.2 fL (ref 80.0–100.0)
Platelets: 323 10*3/uL (ref 150–400)
RBC: 5.44 MIL/uL (ref 4.22–5.81)
RDW: 13.2 % (ref 11.5–15.5)
WBC: 12.7 10*3/uL — ABNORMAL HIGH (ref 4.0–10.5)
nRBC: 0 % (ref 0.0–0.2)

## 2021-12-30 LAB — COMPREHENSIVE METABOLIC PANEL
ALT: 24 U/L (ref 0–44)
AST: 25 U/L (ref 15–41)
Albumin: 4.1 g/dL (ref 3.5–5.0)
Alkaline Phosphatase: 58 U/L (ref 38–126)
Anion gap: 8 (ref 5–15)
BUN: 8 mg/dL (ref 6–20)
CO2: 28 mmol/L (ref 22–32)
Calcium: 9.7 mg/dL (ref 8.9–10.3)
Chloride: 104 mmol/L (ref 98–111)
Creatinine, Ser: 1.05 mg/dL (ref 0.61–1.24)
GFR, Estimated: >60 mL/min (ref >60)
Glucose, Bld: 99 mg/dL (ref 70–99)
Potassium: 3.9 mmol/L (ref 3.5–5.1)
Sodium: 140 mmol/L (ref 135–145)
Total Bilirubin: 0.6 mg/dL (ref 0.3–1.2)
Total Protein: 7.6 g/dL (ref 6.5–8.1)

## 2021-12-30 LAB — LIPASE, BLOOD: Lipase: 32 U/L (ref 11–51)

## 2021-12-30 LAB — URINALYSIS, ROUTINE W REFLEX MICROSCOPIC
Bacteria, UA: NONE SEEN
Bilirubin Urine: NEGATIVE
Glucose, UA: NEGATIVE mg/dL
Ketones, ur: NEGATIVE mg/dL
Leukocytes,Ua: NEGATIVE
Nitrite: NEGATIVE
Protein, ur: 100 mg/dL — AB
Specific Gravity, Urine: 1.023 (ref 1.005–1.030)
pH: 5 (ref 5.0–8.0)

## 2021-12-30 LAB — RESP PANEL BY RT-PCR (FLU A&B, COVID) ARPGX2
Influenza A by PCR: NEGATIVE
Influenza B by PCR: NEGATIVE
SARS Coronavirus 2 by RT PCR: NEGATIVE

## 2021-12-30 MED ORDER — ONDANSETRON HCL 4 MG/2ML IJ SOLN
4.0000 mg | Freq: Once | INTRAMUSCULAR | Status: AC
  Administered 2021-12-30: 4 mg via INTRAVENOUS
  Filled 2021-12-30: qty 2

## 2021-12-30 MED ORDER — IOHEXOL 300 MG/ML  SOLN
100.0000 mL | Freq: Once | INTRAMUSCULAR | Status: AC | PRN
  Administered 2021-12-30: 100 mL via INTRAVENOUS

## 2021-12-30 MED ORDER — LACTATED RINGERS IV BOLUS
1000.0000 mL | Freq: Once | INTRAVENOUS | Status: AC
  Administered 2021-12-30: 1000 mL via INTRAVENOUS

## 2021-12-30 MED ORDER — ONDANSETRON 4 MG PO TBDP: 4.0000 mg | ORAL_TABLET | Freq: Three times a day (TID) | ORAL | 0 refills | Status: DC | PRN

## 2021-12-30 MED ORDER — METOCLOPRAMIDE HCL 5 MG/ML IJ SOLN
5.0000 mg | Freq: Once | INTRAMUSCULAR | Status: AC
  Administered 2021-12-30: 5 mg via INTRAVENOUS
  Filled 2021-12-30: qty 2

## 2021-12-30 MED ORDER — PANTOPRAZOLE SODIUM 40 MG IV SOLR
40.0000 mg | Freq: Once | INTRAVENOUS | Status: AC
  Administered 2021-12-30: 40 mg via INTRAVENOUS
  Filled 2021-12-30: qty 10

## 2021-12-30 MED ORDER — ONDANSETRON 4 MG PO TBDP
4.0000 mg | ORAL_TABLET | Freq: Once | ORAL | Status: AC
  Administered 2021-12-30: 4 mg via ORAL
  Filled 2021-12-30: qty 1

## 2021-12-30 MED ORDER — OMEPRAZOLE 20 MG PO CPDR: 20.0000 mg | DELAYED_RELEASE_CAPSULE | Freq: Two times a day (BID) | ORAL | 0 refills | Status: DC

## 2021-12-30 NOTE — ED Triage Notes (Signed)
The pt is c/o abd pain with nausea and vomiting for 3 weeks intermittently  [facility] and he was give meds that he did not get filled ?

## 2021-12-30 NOTE — ED Provider Triage Note (Signed)
Emergency Medicine Provider Triage Evaluation Note ? ?Victor Monroe , a 36 y.o. male  was evaluated in triage.  Pt complains of epigastric abdominal pain and vomiting x 3 weeks. States he had a perforated ulcer in the past and required surgery. Had one episode today where he saw blood in his vomit.  ? ?Review of Systems  ?Positive: Abdominal pain, vomiting ?Negative: Diarrhea, constipation ? ?Physical Exam  ?Ht 5\' 9"  (1.753 m)   Wt (!) 149.7 kg   BMI 48.74 kg/m?  ?Gen:   Awake, no distress   ?Resp:  Normal effort  ?MSK:   Moves extremities without difficulty  ?Other:   ? ?Medical Decision Making  ?Medically screening exam initiated at 6:01 PM.  Appropriate orders placed.  Victor Monroe was informed that the remainder of the evaluation will be completed by another provider, this initial triage assessment does not replace that evaluation, and the importance of remaining in the ED until their evaluation is complete. ? ?Zofran given in triage ?  ?Victor Heritage, PA-C ?12/30/21 1803 ? ?

## 2021-12-30 NOTE — Discharge Instructions (Addendum)
Like we discussed, your CT scan was concerning for a new peptic ulcer.  I would recommend that you cut back on your alcohol use.  Please also make sure that you avoid any nonsteroidal anti-inflammatory medications like ibuprofen, naproxen, Aleve, as well as Goody powders. ? ?I am prescribing you a medication to help reduce stomach acid production.  This is called omeprazole.  Please take this once per day as prescribed. ? ?I am prescribing you a medication called Zofran.  This is a disintegrating tablet you can use up to 3 times a day for management of your nausea and vomiting.  Please only take this as prescribed.  Please only take this if you are experiencing nausea and vomiting that you cannot control. ? ?Below is the contact information for Dr. Loreta Ave.  She is a Customer service manager.  Please schedule an appointment for reevaluation.  I have also put in a referral to [facility] community [facility] wellness.  Their contact information is below.  Please call them and schedule an appointment for reevaluation as well.  They can act as your primary care provider. ? ?If you develop any new or worsening symptoms please come back to the emergency department immediately. ?

## 2021-12-30 NOTE — ED Notes (Signed)
Discharge instructions reviewed with patient. Patient verbalized understanding of instructions. Follow-up care and medications were reviewed. Patient ambulatory with steady gait. VSS upon discharge.  ?

## 2021-12-30 NOTE — ED Provider Notes (Signed)
?[facility] ?Provider Note ? ? ?CSN: [id number] ?Arrival date & time: 12/30/21  1749 ? ?  ? ?History ? ?Chief Complaint  ?Patient presents with  ? Abdominal Pain  ? ? ?Victor Monroe is a 36 y.o. male. ? ?HPI ?Patient is a 36 year old male with history of polysubstance abuse as well as perforated duodenal ulcer and viscus ulcer who presents to the emergency department due to persistent upper abdominal pain as well as nausea and vomiting for the past 3 weeks.  States his symptoms are intermittent.  States that he will develop upper abdominal pain and then recurrent nausea and vomiting.  Worsens significantly when eating.  Denies taking any antacids.  States that he drinks about 3 alcoholic beverages per day but has not been drinking as much alcohol since the onset of his symptoms.  States he has been taking Aleve for his pain.  Denies any diarrhea, GU complaints, URI complaints. ?  ? ?Home Medications ?Prior to Admission medications   ?Medication Sig Start Date End Date Taking? Authorizing Provider  ?omeprazole (PRILOSEC) 20 MG capsule Take 1 capsule (20 mg total) by mouth 2 (two) times daily before a meal. 12/30/21 02/28/22 Yes Placido SouJoldersma, Ashvin Adelson, PA-C  ?ondansetron (ZOFRAN-ODT) 4 MG disintegrating tablet Take 1 tablet (4 mg total) by mouth every 8 (eight) hours as needed for nausea or vomiting. 12/30/21  Yes Placido SouJoldersma, Lakeasha Petion, PA-C  ?acetaminophen (TYLENOL) 500 MG tablet Take 500 mg by mouth every 6 (six) hours as needed.    [provider]  ?meloxicam (MOBIC) 15 MG tablet Take 1 tablet (15 mg total) by mouth daily. 02/25/21   Rushie Chestnutovington, Sarah M, PA-C  ?methocarbamol (ROBAXIN) 500 MG tablet Take 1 tablet (500 mg total) by mouth 2 (two) times daily. 02/25/21   Rushie Chestnutovington, Sarah M, PA-C  ?ondansetron (ZOFRAN) 4 MG tablet Take 1 tablet (4 mg total) by mouth every 8 (eight) hours as needed for nausea or vomiting. 12/18/21   Bing NeighborsHarris, Kimberly S, FNP  ?   ? ?Allergies    ?Patient  has no known allergies.   ? ?Review of Systems   ?Review of Systems  ?All other systems reviewed and are negative. ?Ten systems reviewed and are negative for acute change, except as noted in the HPI.   ?Physical Exam ?Updated Vital Signs ?BP (!) 143/94   Pulse 68   Temp 99.2 ?F (37.3 ?C)   Resp 18   Ht 5\' 9"  (1.753 m)   Wt (!) 149.7 kg   SpO2 100%   BMI 48.74 kg/m?  ?Physical Exam ?Vitals and nursing note reviewed.  ?Constitutional:   ?   General: He is not in acute distress. ?   Appearance: Normal appearance. He is not ill-appearing, toxic-appearing or diaphoretic.  ?HENT:  ?   Head: Normocephalic and atraumatic.  ?   Right Ear: External ear normal.  ?   Left Ear: External ear normal.  ?   Nose: Nose normal.  ?   Mouth/Throat:  ?   Mouth: Mucous membranes are moist.  ?   Pharynx: Oropharynx is clear. No oropharyngeal exudate or posterior oropharyngeal erythema.  ?Eyes:  ?   Extraocular Movements: Extraocular movements intact.  ?Cardiovascular:  ?   Rate and Rhythm: Normal rate and regular rhythm.  ?   Pulses: Normal pulses.  ?   Heart sounds: Normal heart sounds. No murmur heard. ?  No friction rub. No gallop.  ?Pulmonary:  ?   Effort: Pulmonary effort is normal. No  respiratory distress.  ?   Breath sounds: Normal breath sounds. No stridor. No wheezing, rhonchi or rales.  ?Abdominal:  ?   General: Abdomen is protuberant.  ?   Palpations: Abdomen is soft.  ?   Tenderness: There is abdominal tenderness in the epigastric area.  ?   Comments: Protuberant abdomen that is soft.  Moderate tenderness noted overlying the epigastrium.  Negative Murphy sign.  No McBurney's point tenderness.  ?Musculoskeletal:     ?   General: Normal range of motion.  ?   Cervical back: Normal range of motion and neck supple. No tenderness.  ?Skin: ?   General: Skin is warm and dry.  ?Neurological:  ?   General: No focal deficit present.  ?   Mental Status: He is alert and oriented to person, place, and time.  ?Psychiatric:     ?   Mood  and Affect: Mood normal.     ?   Behavior: Behavior normal.  ? ?ED Results / Procedures / Treatments   ?Labs ?(all labs ordered are listed, but only abnormal results are displayed) ?Labs Reviewed  ?CBC - Abnormal; Notable for the following components:  ?    Result Value  ? WBC 12.7 (*)   ? All other components within normal limits  ?URINALYSIS, ROUTINE W REFLEX MICROSCOPIC - Abnormal; Notable for the following components:  ? Hgb urine dipstick SMALL (*)   ? Protein, ur 100 (*)   ? All other components within normal limits  ?RESP PANEL BY RT-PCR (FLU A&B, COVID) ARPGX2  ?LIPASE, BLOOD  ?COMPREHENSIVE METABOLIC PANEL  ? ?EKG ?None ? ?Radiology ?CT ABDOMEN PELVIS W CONTRAST ? ?Result Date: 12/30/2021 ?CLINICAL DATA:  epigastic pain. a 36 y.o. male was evaluated in triage. Pt complains of epigastric abdominal pain and vomiting x 3 weeks. States he had a perforated ulcer EXAM: CT ABDOMEN AND PELVIS WITH CONTRAST TECHNIQUE: Multidetector CT imaging of the abdomen and pelvis was performed using the standard protocol following bolus administration of intravenous contrast. RADIATION DOSE REDUCTION: This exam was performed according to the departmental dose-optimization program which includes automated exposure control, adjustment of the mA and/or kV according to patient size and/or use of iterative reconstruction technique. CONTRAST:  OMNIPAQUE IOHEXOL 300 MG/ML  SOLN COMPARISON:  Chest x-ray 03/18/2019 FINDINGS: Lower chest: Marked right gynecomastia. Inferior left breast collimated off view. No acute abnormality. Hepatobiliary: No focal liver abnormality. No gallstones, gallbladder wall thickening, or pericholecystic fluid. No biliary dilatation. Pancreas: No focal lesion. Normal pancreatic contour. No surrounding inflammatory changes. No main pancreatic ductal dilatation. Spleen: Normal in size without focal abnormality. Adrenals/Urinary Tract: No adrenal nodule bilaterally. Bilateral kidneys enhance symmetrically.  No hydronephrosis. No hydroureter. The urinary bladder is unremarkable. Stomach/Bowel: Stomach is within normal limits. Ulceration along the pylorus with associated trace surrounding fat stranding (3:31, 6:46, 7:81). No evidence of bowel wall thickening or dilatation. Appendix appears normal. Vascular/Lymphatic: No abdominal aorta or iliac aneurysm. Mild atherosclerotic plaque of the aorta and its branches. No abdominal, pelvic, or inguinal lymphadenopathy. Reproductive: Prostate is unremarkable. Other: No intraperitoneal free fluid. No intraperitoneal free gas. No organized fluid collection. Musculoskeletal: No abdominal wall hernia or abnormality. No suspicious lytic or blastic osseous lesions. No acute displaced fracture. 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 IMPRESSION: 1. Minimally inflamed pyloric ulceration. 2. 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 3. Marked right gynecomastia. Inferior left breast collimated off view. 4.  Aortic Atherosclerosis (ICD10-I70.0). Electronically Signed   By: Tish Frederickson M.D.   On: 12/30/2021 21:20   ? ?Procedures ?Procedures  ? ?Medications Ordered in ED ?Medications  ?ondansetron (ZOFRAN-ODT) disintegrating tablet 4 mg (4 mg Oral Given 12/30/21 1805)  ?lactated ringers bolus 1,000 mL (1,000 mLs Intravenous New Bag/Given 12/30/21 2023)  ?pantoprazole (PROTONIX) injection 40 mg (40 mg Intravenous Given 12/30/21 2018)  ?metoCLOPramide (REGLAN) injection 5 mg (5 mg Intravenous Given 12/30/21 2015)  ?iohexol (OMNIPAQUE) 300 MG/ML solution 100 mL (100 mLs Intravenous Contrast Given 12/30/21 2052)  ?ondansetron [facility]) injection 4 mg (4 mg Intravenous Given 12/30/21 2120)  ? ?ED Course/ Medical Decision Making/ A&P ?  ?                        ?Medical Decision Making ?Amount and/or Complexity of Data Reviewed ?Radiology: ordered. ? ?Risk ?Prescription drug management. ? ? ?Pt is a 36  y.o. male with a history of perforated peptic ulcers who presents to the emergency department due to upper abdominal pain as well as nausea and vomiting. ? ?Labs: ?CBC with a white count of 12.7. ?Lipase of 32.

## 2021-12-31 ENCOUNTER — Other Ambulatory Visit: Payer: Self-pay

## 2021-12-31 MED ORDER — ONDANSETRON 4 MG PO TBDP
ORAL_TABLET | ORAL | 0 refills | Status: DC
  Filled 2021-12-31: qty 8, 3d supply, fill #0

## 2021-12-31 MED ORDER — OMEPRAZOLE 20 MG PO CPDR
DELAYED_RELEASE_CAPSULE | ORAL | 0 refills | Status: DC
  Filled 2021-12-31: qty 60, 30d supply, fill #0
  Filled 2022-03-29: qty 60, 30d supply, fill #1

## 2022-03-29 ENCOUNTER — Other Ambulatory Visit: Payer: Self-pay

## 2022-03-30 ENCOUNTER — Other Ambulatory Visit: Payer: Self-pay

## 2022-04-16 ENCOUNTER — Other Ambulatory Visit: Payer: Self-pay

## 2022-04-16 ENCOUNTER — Encounter (HOSPITAL_COMMUNITY): Payer: Self-pay | Admitting: Emergency Medicine

## 2022-04-16 ENCOUNTER — Emergency Department (HOSPITAL_COMMUNITY)
Admission: EM | Admit: 2022-04-16 | Discharge: 2022-04-16 | Disposition: A | Discharge status: Home / Self Care | Payer: Self-pay | Attending: Emergency Medicine | Admitting: Emergency Medicine

## 2022-04-16 DIAGNOSIS — J020 Streptococcal pharyngitis: Secondary | ICD-10-CM | POA: Insufficient documentation

## 2022-04-16 LAB — GROUP A STREP BY PCR: Group A Strep by PCR: DETECTED — AB

## 2022-04-16 MED ORDER — PENICILLIN V POTASSIUM 500 MG PO TABS: 500.0000 mg | ORAL_TABLET | Freq: Two times a day (BID) | ORAL | 0 refills | Status: AC

## 2022-04-16 NOTE — ED Triage Notes (Signed)
Pt reported to ED with c/o with sore throat x1 week that has worsened over past few days. Pt endorses painful swallowing and lack of appetite d/t pain.

## 2022-04-16 NOTE — Discharge Instructions (Addendum)
It was a pleasure taking care of you today!  Your strep test was positive in the ED.  You will be sent a prescription for penicillin, take as directed. You may take over-the-counter 600 mg ibuprofen every 6 hours or 500 mg Tylenol every 6 hours as needed for pain for no more than 7 days.  Ensure to maintain fluid intake with tea, broth, soup, Gatorade, Pedialyte, water.  You may follow-up with your primary care provider as needed.  Return to the emergency department if experiencing increasing/worsening trouble swallowing, trouble breathing, fever, worsening symptoms.

## 2022-04-16 NOTE — ED Provider Notes (Signed)
[facility] EMERGENCY DEPARTMENT Provider Note   CSN: [id number] Arrival date & time: 04/16/22  0355     History  Chief Complaint  Patient presents with   Sore Throat    Victor Monroe is a 36 y.o. male who presents to the ED complaining of sore throat onset 1 week. Denies sick contacts. Has associated painful swallowing.  Has tried Robitussin and Chloraseptic spray without relief of his symptoms.  Denies trouble swallowing, fever, rhinorrhea, nasal congestion, cough.    The history is provided by the patient. No language interpreter was used.       Home Medications Prior to Admission medications   Medication Sig Start Date End Date Taking? Authorizing Provider  penicillin v potassium (VEETID) 500 MG tablet Take 1 tablet (500 mg total) by mouth 2 (two) times daily for 10 days. 04/16/22 04/26/22 Yes Angell Pincock A, PA-C  acetaminophen (TYLENOL) 500 MG tablet Take 500 mg by mouth every 6 (six) hours as needed.    [provider]  meloxicam (MOBIC) 15 MG tablet Take 1 tablet (15 mg total) by mouth daily. 02/25/21   Rushie Chestnut, PA-C  methocarbamol (ROBAXIN) 500 MG tablet Take 1 tablet (500 mg total) by mouth 2 (two) times daily. 02/25/21   Rushie Chestnut, PA-C  omeprazole (PRILOSEC) 20 MG capsule Take 1 capsule (20 mg total) by mouth 2 (two) times daily before a meal. 12/30/21 02/28/22  Placido Sou, PA-C  omeprazole (PRILOSEC) 20 MG capsule take 1 capsule by mouth twice a day before a meal 12/30/21   Placido Sou, PA-C  ondansetron (ZOFRAN) 4 MG tablet Take 1 tablet (4 mg total) by mouth every 8 (eight) hours as needed for nausea or vomiting. 12/18/21   Bing Neighbors, FNP  ondansetron (ZOFRAN-ODT) 4 MG disintegrating tablet Take 1 tablet (4 mg total) by mouth every 8 (eight) hours as needed for nausea or vomiting. 12/30/21   Placido Sou, PA-C  ondansetron (ZOFRAN-ODT) 4 MG disintegrating tablet Dissolve 1 tablet by mouth every 8 hours  as needed for nausea or vomiting 12/30/21   Placido Sou, PA-C      Allergies    Patient has no known allergies.    Review of Systems   Review of Systems  Constitutional:  Negative for fever.  HENT:  Positive for sore throat. Negative for congestion, rhinorrhea and trouble swallowing.   Respiratory:  Negative for cough.   All other systems reviewed and are negative.   Physical Exam Updated Vital Signs BP 115/68 (BP Location: Right Wrist)   Pulse 80   Temp 98.4 F (36.9 C) (Oral)   Resp 17   SpO2 99%  Physical Exam Vitals and nursing note reviewed.  Constitutional:      General: He is not in acute distress.    Appearance: He is not diaphoretic.  HENT:     Head: Normocephalic and atraumatic.     Mouth/Throat:     Mouth: Mucous membranes are moist.     Pharynx: Oropharynx is clear. Uvula midline. Posterior oropharyngeal erythema present. No oropharyngeal exudate or uvula swelling.     Tonsils: No tonsillar exudate or tonsillar abscesses.  Eyes:     General: No scleral icterus.    Conjunctiva/sclera: Conjunctivae normal.  Cardiovascular:     Rate and Rhythm: Normal rate and regular rhythm.     Pulses: Normal pulses.     Heart sounds: Normal heart sounds.  Pulmonary:     Effort: Pulmonary effort is normal. No  respiratory distress.     Breath sounds: Normal breath sounds. No wheezing.  Abdominal:     General: Bowel sounds are normal.     Palpations: Abdomen is soft. There is no mass.     Tenderness: There is no abdominal tenderness. There is no guarding or rebound.  Musculoskeletal:        General: Normal range of motion.     Cervical back: Normal range of motion and neck supple.  Skin:    General: Skin is warm and dry.  Neurological:     Mental Status: He is alert.  Psychiatric:        Behavior: Behavior normal.     ED Results / Procedures / Treatments   Labs (all labs ordered are listed, but only abnormal results are displayed) Labs Reviewed  GROUP A  STREP BY PCR - Abnormal; Notable for the following components:      Result Value   Group A Strep by PCR DETECTED (*)    All other components within normal limits    EKG None  Radiology No results found.  Procedures Procedures    Medications Ordered in ED Medications - No data to display  ED Course/ Medical Decision Making/ A&P Clinical Course as of 04/16/22 6283  Fri Apr 16, 2022  0550 Group A Strep by PCR(!): DETECTED [SB]    Clinical Course User Index [SB] Daray Polgar A, PA-C                           Medical Decision Making Amount and/or Complexity of Data Reviewed Labs:  Decision-making details documented in ED Course.   Patient with sore throat onset 1 week.  Denies sick contacts.  On exam patient with erythema notes to bilateral tonsils. Uvula midline without swelling.  No acute cardiovascular, respiratory, exam findings.  Differential diagnosis includes strep pharyngitis, peritonsillar abscess, strep pharyngitis, or Ludwig's angina.   Labs:  I ordered, and personally interpreted labs.  The pertinent results include:   Strep swab positive.  Disposition: Pt presentation suspicious for strep pharyngitis. Doubt COVID or flu at this time. Patent airway, tolerating secretions, no concern for airway compromise. Less likely Ludwigs angina, no trismus or edema to floor of mouth on exam. Less likely peritonsillar abscess, no fluctuant abscess noted on exam, patent airway, oxygen saturation 100%, and tolerating secretions. Pt will be discharged with penicillin prescription. After consideration of the diagnostic results and the patients response to treatment, I feel that the patient would benefit from Discharge home. Strict return precautions discussed including inability to tolerate secretions, fever, inability to open mouth.  Patient acknowledges and verbalizes understanding.  Recommended primary care follow-up.  Patient appears safe for discharge at this time.  Follow-up as  indicated in discharge paperwork.  This chart was dictated using voice recognition software, Dragon. Despite the best efforts of this provider to proofread and correct errors, errors may still occur which can change documentation meaning.   Final Clinical Impression(s) / ED Diagnoses Final diagnoses:  Strep pharyngitis    Rx / DC Orders ED Discharge Orders          Ordered    penicillin v potassium (VEETID) 500 MG tablet  2 times daily        04/16/22 0616              Dvonte Gatliff A, PA-C 04/16/22 1517    Mesner, Barbara Cower, MD 04/16/22 [phone]

## 2022-04-29 IMAGING — CT CT ABD-PELV W/ CM
2 of 4 series · 16 of 46 positions shown, 18 images · IV contrast (agent unspecified)
Comparison: Chest x-ray 03/18/2019

CLINICAL DATA: epigastic pain. a 35 y.o. male was evaluated in
triage. Pt complains of epigastric abdominal pain and vomiting x 3
weeks. States he had a perforated ulcer

EXAM:
CT ABDOMEN AND PELVIS WITH CONTRAST
TECHNIQUE: Multidetector CT imaging of the abdomen and pelvis was performed
using the standard protocol following bolus administration of
intravenous contrast.

[Series 3: abd/ pelvis 5.0 i30f 2 · axial · 0.98mm/px · z∈[+627,+1142]mm · 13 of 113 slices shown, 15 images]
[im 5/113  soft-tissue]
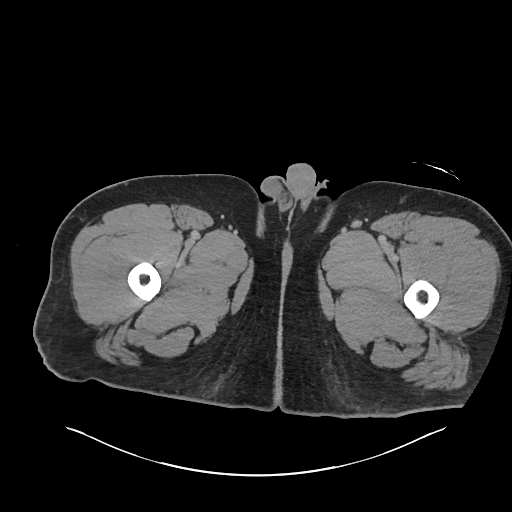
[im 5/113  bone]
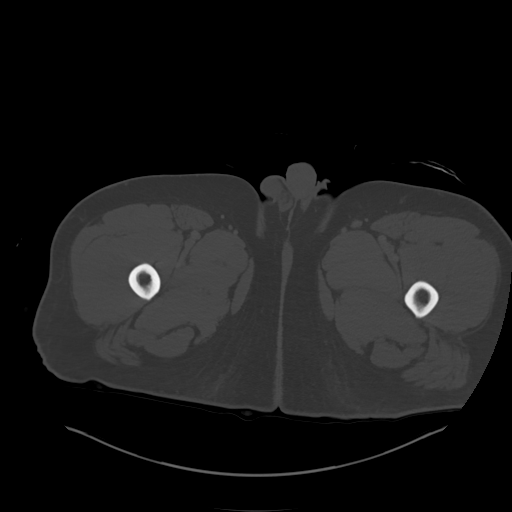
[im 15/113  soft-tissue]
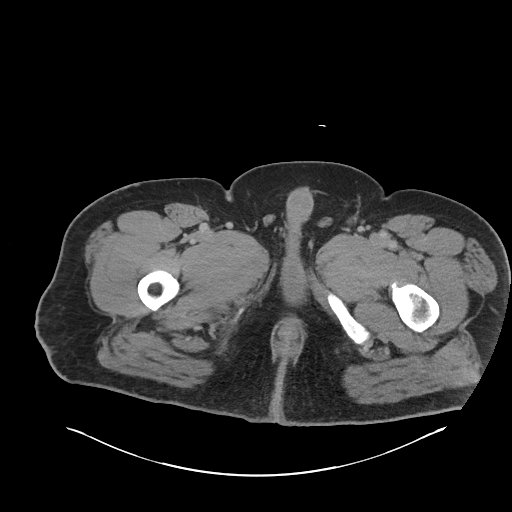
[im 25/113  soft-tissue]
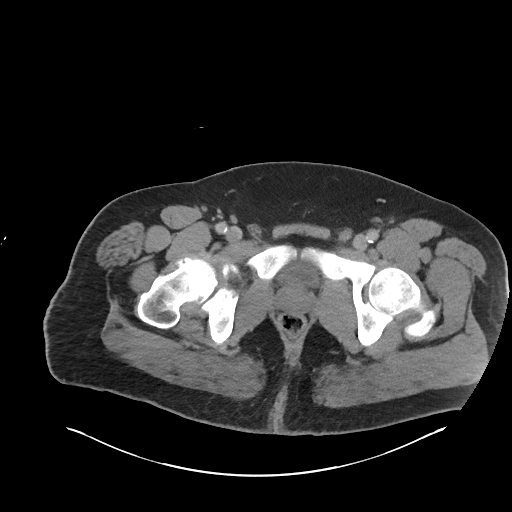
[im 30/113  soft-tissue]
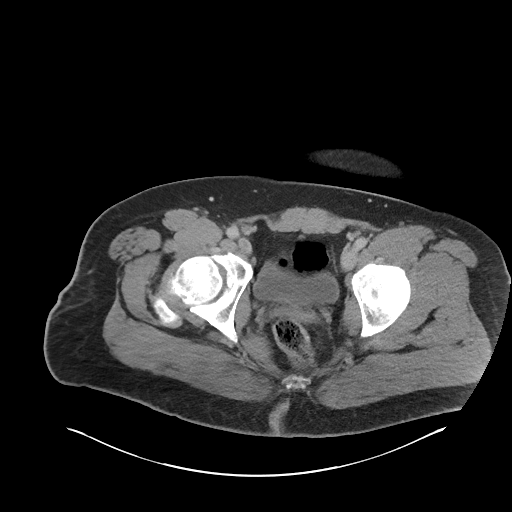
[im 39/113  soft-tissue]
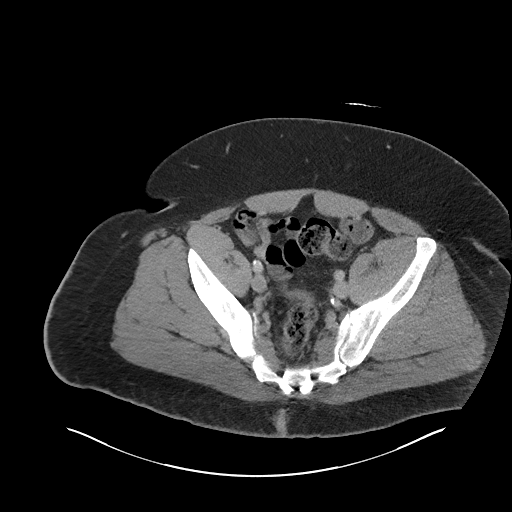
[im 49/113  soft-tissue]
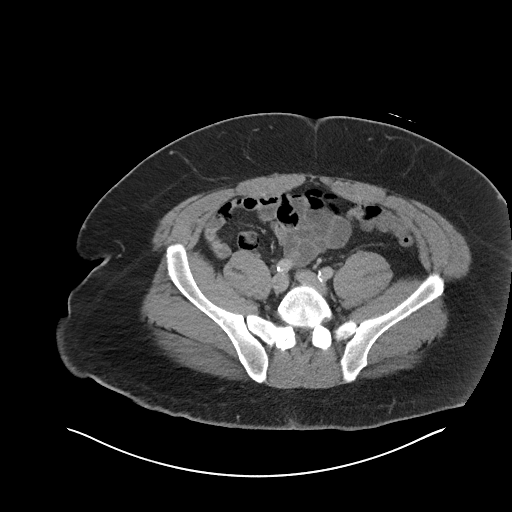
[im 59/113  soft-tissue]
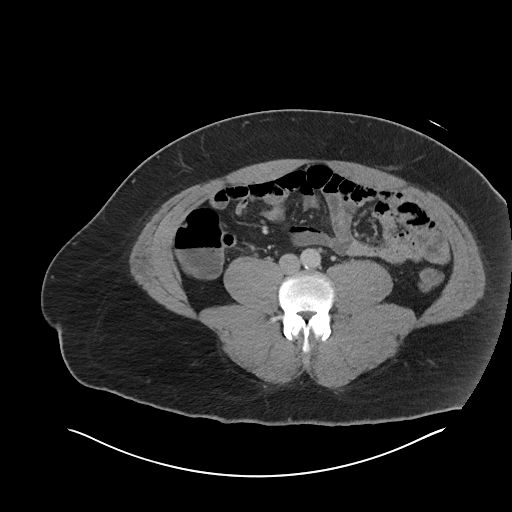
[im 64/113  soft-tissue]
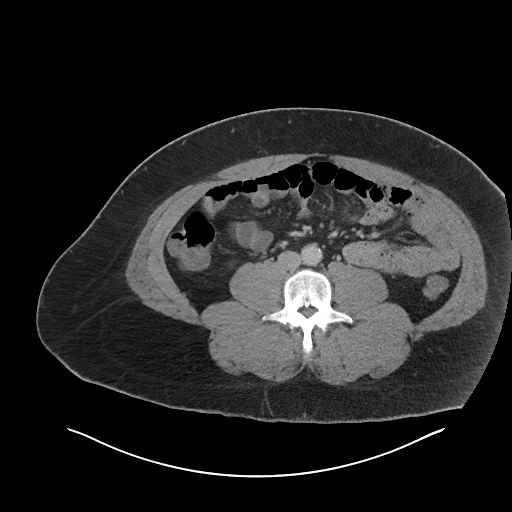
[im 74/113  soft-tissue]
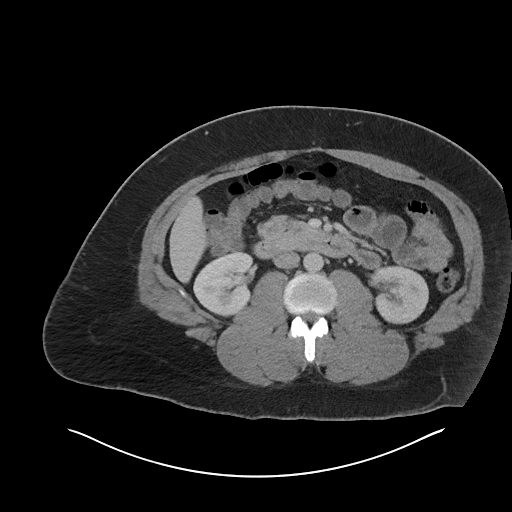
[im 74/113  bone]
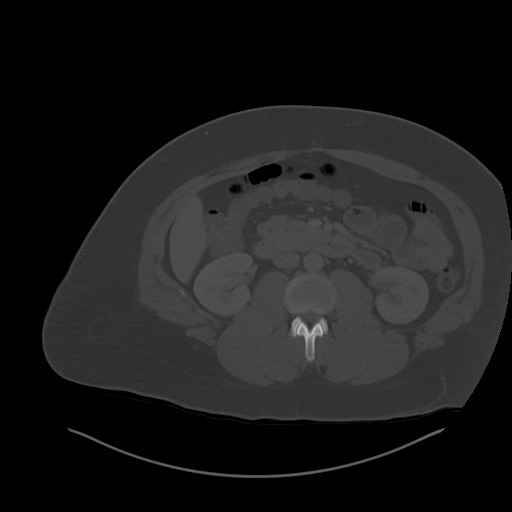
[im 83/113  soft-tissue]
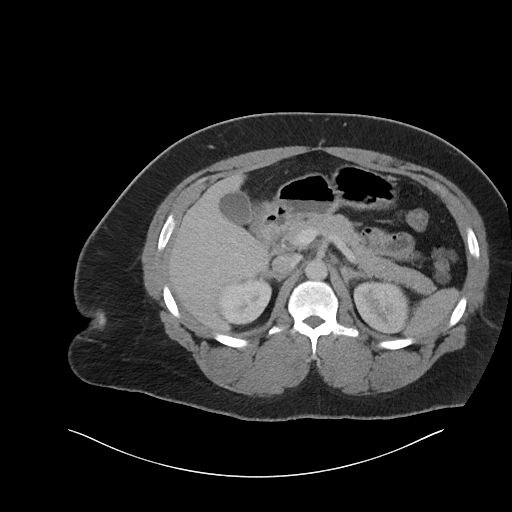
[im 88/113  soft-tissue]
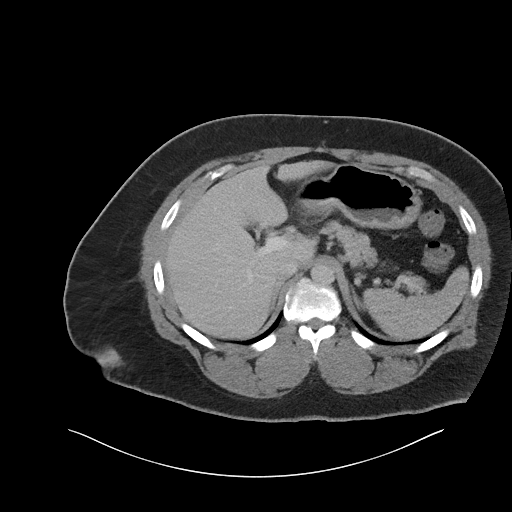
[im 98/113  soft-tissue]
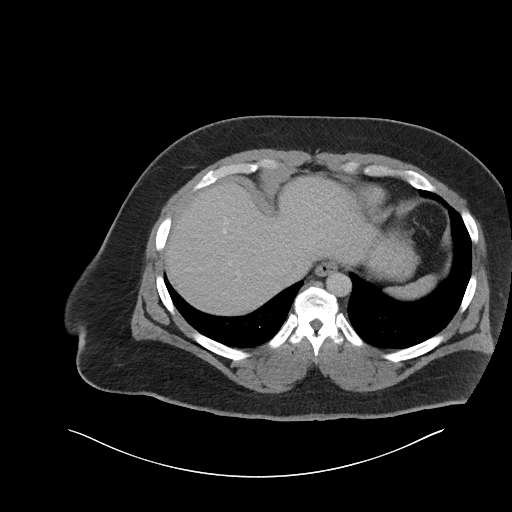
[im 108/113  soft-tissue]
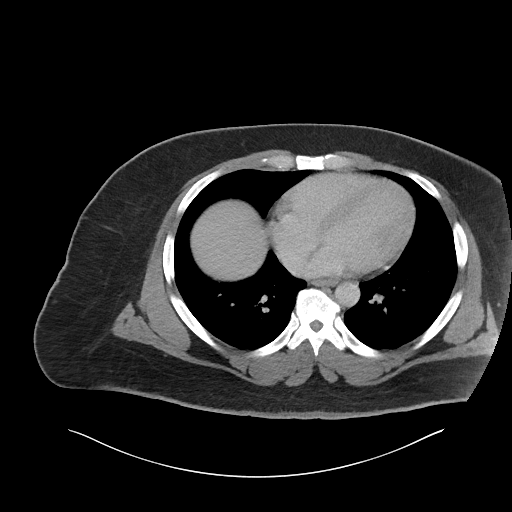

[Series 6: coronal soft tissue · coronal · 0.97mm/px · 3 of 126 slices shown]
[im 42/126  soft-tissue]
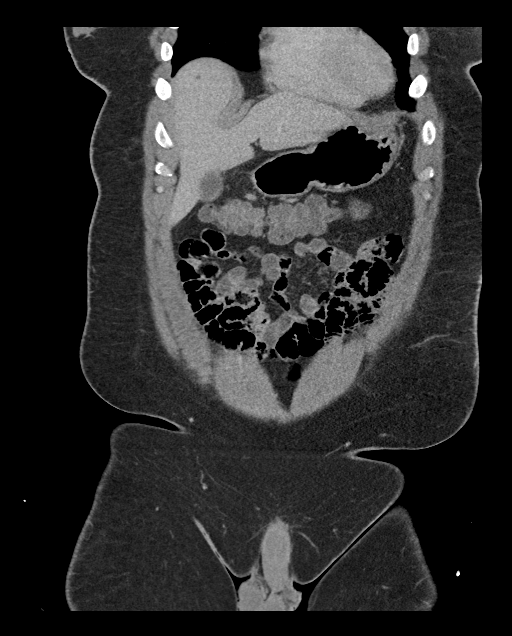
[im 56/126  soft-tissue]
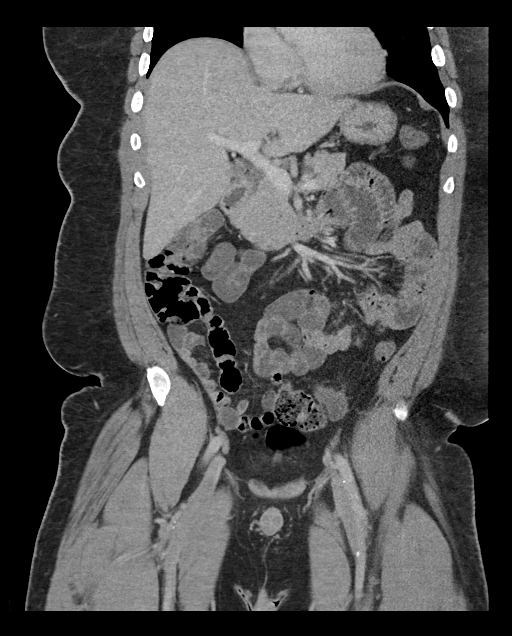
[im 70/126  soft-tissue]
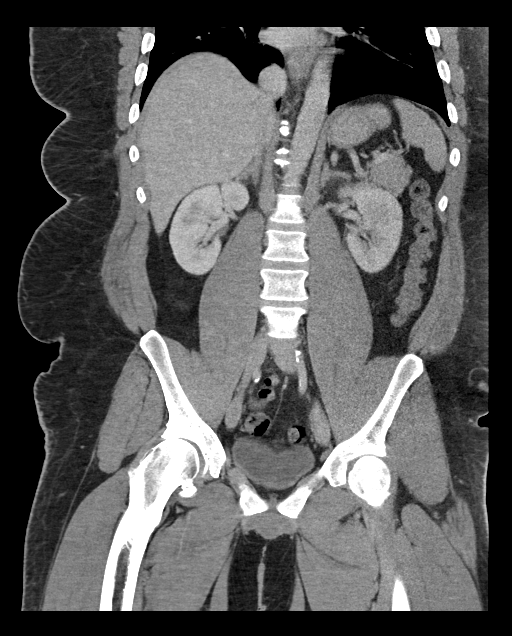

[16 of 46 positions shown; findings below may reference images not displayed]

RADIATION DOSE REDUCTION: This exam was performed according to the
departmental dose-optimization program which includes automated
exposure control, adjustment of the mA and/or kV according to
patient size and/or use of iterative reconstruction technique.

CONTRAST:  100mL OMNIPAQUE IOHEXOL 300 MG/ML  SOLN
FINDINGS: Lower chest: Marked right gynecomastia. Inferior left breast
collimated off view. No acute abnormality.

Hepatobiliary: No focal liver abnormality. No gallstones,
gallbladder wall thickening, or pericholecystic fluid. No biliary
dilatation.

Pancreas: No focal lesion. Normal pancreatic contour. No surrounding
inflammatory changes. No main pancreatic ductal dilatation.

Spleen: Normal in size without focal abnormality.

Adrenals/Urinary Tract:

No adrenal nodule bilaterally.

Bilateral kidneys enhance symmetrically.

No hydronephrosis. No hydroureter.

The urinary bladder is unremarkable.

Stomach/Bowel: Stomach is within normal limits. Ulceration along the
pylorus with associated trace surrounding fat stranding ([DATE], [DATE],
[DATE]). No evidence of bowel wall thickening or dilatation. Appendix
appears normal.

Vascular/Lymphatic: No abdominal aorta or iliac aneurysm. Mild
atherosclerotic plaque of the aorta and its branches. No abdominal,
pelvic, or inguinal lymphadenopathy.

Reproductive: Prostate is unremarkable.

Other: No intraperitoneal free fluid. No intraperitoneal free gas.
No organized fluid collection.

Musculoskeletal:

No abdominal wall hernia or abnormality.

No suspicious lytic or blastic osseous lesions. No acute displaced
fracture. Interval worsening of moderate to severe degenerative
changes of the right hip.
IMPRESSION: 1. Minimally inflamed pyloric ulceration.
2. Interval worsening of advanced for age moderate to severe
degenerative changes of the right hip.
3. Marked right gynecomastia. Inferior left breast collimated off
view.
4.  Aortic Atherosclerosis ([id number]).

## 2022-11-15 ENCOUNTER — Other Ambulatory Visit: Payer: Self-pay

## 2022-12-29 ENCOUNTER — Encounter (HOSPITAL_COMMUNITY): Payer: Self-pay | Admitting: Emergency Medicine

## 2022-12-29 ENCOUNTER — Ambulatory Visit (HOSPITAL_COMMUNITY)
Admission: EM | Admit: 2022-12-29 | Discharge: 2022-12-29 | Disposition: A | Discharge status: Home / Self Care | Payer: Self-pay | Attending: Emergency Medicine | Admitting: Emergency Medicine

## 2022-12-29 ENCOUNTER — Other Ambulatory Visit: Payer: Self-pay

## 2022-12-29 DIAGNOSIS — M7662 Achilles tendinitis, left leg: Secondary | ICD-10-CM

## 2022-12-29 MED ORDER — PREDNISONE 10 MG (21) PO TBPK: ORAL_TABLET | Freq: Every day | ORAL | 0 refills | Status: DC

## 2022-12-29 NOTE — Discharge Instructions (Addendum)
Continue using the walking boot to give your ankle support right now. If you are not getting better, contact the sports medicine clinic listed below for follow up care.

## 2022-12-29 NOTE — ED Triage Notes (Signed)
Patient woke and got out of bed to go to the bathroom, but first step out of bed it was obvious there was extreme pain involving Achilles.  No known injury.  Pain started, mild pain 2-3 days ago.    Patient has a CAM walker from a prior event and is currently wearing this boot.    Patient is taking amoxicillin- first dose yesterday.

## 2022-12-29 NOTE — ED Provider Notes (Signed)
[place]    CSN: [id number] Arrival date & time: 12/29/22  1716      History   Chief Complaint Chief Complaint  Patient presents with   Foot Pain    HPI Victor Monroe is a 37 y.o. male.   Pt reports was fine all day 12/26/22 and did nothing out of the ordinary. Woke up in middle of the night later that night to use the bathroom and had severe posterior L ankle pain and couldn't bear weight due to pain. Denies hearing a "pop" when he got out of bed. Denies specific injury. This happened before about 3 years ago, was treated with prednisone and a walking boot. Pt reports this treatment worked within days and he has not had similar symptoms since until now.    Foot Pain    Past Medical History:  Diagnosis Date   Polysubstance abuse [facility])     Patient Active Problem List   Diagnosis Date Noted   Perforated viscus 01/22/2015   Perforated duodenal ulcer ([place]) 01/22/2015    Past Surgical History:  Procedure Laterality Date   ABDOMINAL SURGERY     LAPAROTOMY N/A 01/22/2015   Procedure: EXPLORATORY LAPAROTOMY,  GRAHAM PATCH CLOSURE OF PERFORATED DUODENAL ULCER;  Surgeon: Armandina Gemma, MD;  Location: [facility];  Service: General;  Laterality: N/A;       Home Medications    Prior to Admission medications   Medication Sig Start Date End Date Taking? Authorizing Provider  predniSONE (STERAPRED UNI-PAK 21 TAB) 10 MG (21) TBPK tablet Take by mouth daily. Take 6 tabs by mouth daily  for 2 days, then 5 tabs for 2 days, then 4 tabs for 2 days, then 3 tabs for 2 days, 2 tabs for 2 days, then 1 tab by mouth daily for 2 days 12/29/22  Yes Elvan Ebron, Dionne Bucy, NP    Family History Family History  Problem Relation Age of Onset   Supraventricular tachycardia Mother    Healthy Father     Social History Social History   Tobacco Use   Smoking status: Every Day    Packs/day: 0.50    Years: 20.00    Additional pack years: 0.00    Total pack years: 10.00    Types:  Cigarettes   Smokeless tobacco: Never  Vaping Use   Vaping Use: Never used  Substance Use Topics   Alcohol use: Yes    Alcohol/week: 3.0 standard drinks of alcohol    Types: 3 Glasses of wine per week    Comment: occassionally   Drug use: Yes    Types: Marijuana    Comment: Pt reports he used to do cocaine but quit     Allergies   Patient has no known allergies.   Review of Systems Review of Systems   Physical Exam Triage Vital Signs ED Triage Vitals  Enc Vitals Group     BP 12/29/22 1829 125/71     Pulse Rate 12/29/22 1829 76     Resp 12/29/22 1829 (!) 22     Temp 12/29/22 1829 98.3 F (36.8 C)     Temp Source 12/29/22 1829 Oral     SpO2 12/29/22 1829 95 %     Weight --      Height --      Head Circumference --      Peak Flow --      Pain Score 12/29/22 1827 10     Pain Loc --      Pain  Edu? --      Excl. in [place]? --    No data found.  Updated Vital Signs BP 125/71 (BP Location: Left Arm) Comment (BP Location): large cuff, forearm  Pulse 76   Temp 98.3 F (36.8 C) (Oral)   Resp (!) 22   SpO2 95%   Visual Acuity Right Eye Distance:   Left Eye Distance:   Bilateral Distance:    Right Eye Near:   Left Eye Near:    Bilateral Near:     Physical Exam Constitutional:      Appearance: Normal appearance. He is obese.  Pulmonary:     Effort: Pulmonary effort is normal.  Musculoskeletal:     Right ankle:     Right Achilles Tendon: Normal.     Left ankle:     Left Achilles Tendon: Tenderness present. No defects. Thompson's test negative.  Neurological:     Mental Status: He is alert.      UC Treatments / Results  Labs (all labs ordered are listed, but only abnormal results are displayed) Labs Reviewed - No data to display  EKG   Radiology No results found.  Procedures Procedures (including critical care time)  Medications Ordered in UC Medications - No data to display  Initial Impression / Assessment and Plan / UC Course  I have  reviewed the triage vital signs and the nursing notes.  Pertinent labs & imaging results that were available during my care of the patient were reviewed by me and considered in my medical decision making (see chart for details).    As walking boot + prednisone worked for him 3 years ago, will try it again. Tendon is intact but inflamed. Uncertain etiology. Since this is second occurrence, discussed sports medicine f/u with pt but he does not have insurance. He will contact sports medicine if he is not getting better with this treatment or if it happens again in the near future.   Final Clinical Impressions(s) / UC Diagnoses   Final diagnoses:  Achilles tendinitis of left lower extremity     Discharge Instructions      Continue using the walking boot to give your ankle support right now. If you are not getting better, contact the sports medicine clinic listed below for follow up care.    ED Prescriptions     Medication Sig Dispense Auth. Provider   predniSONE (STERAPRED UNI-PAK 21 TAB) 10 MG (21) TBPK tablet Take by mouth daily. Take 6 tabs by mouth daily  for 2 days, then 5 tabs for 2 days, then 4 tabs for 2 days, then 3 tabs for 2 days, 2 tabs for 2 days, then 1 tab by mouth daily for 2 days 42 tablet Shannette Tabares, Dionne Bucy, NP      PDMP not reviewed this encounter.   Carvel Getting, NP 12/29/22 1900

## 2023-02-25 ENCOUNTER — Encounter (HOSPITAL_COMMUNITY): Payer: Self-pay

## 2023-02-25 ENCOUNTER — Emergency Department (HOSPITAL_COMMUNITY)
Admission: EM | Admit: 2023-02-25 | Discharge: 2023-02-25 | Disposition: A | Discharge status: Home / Self Care | Payer: 59 | Attending: Emergency Medicine | Admitting: Emergency Medicine

## 2023-02-25 ENCOUNTER — Emergency Department (HOSPITAL_COMMUNITY): Payer: 59

## 2023-02-25 ENCOUNTER — Other Ambulatory Visit: Payer: Self-pay

## 2023-02-25 DIAGNOSIS — M25572 Pain in left ankle and joints of left foot: Secondary | ICD-10-CM | POA: Diagnosis not present

## 2023-02-25 DIAGNOSIS — M7662 Achilles tendinitis, left leg: Secondary | ICD-10-CM | POA: Insufficient documentation

## 2023-02-25 MED ORDER — PREDNISONE 10 MG (21) PO TBPK: ORAL_TABLET | Freq: Every day | ORAL | 0 refills | Status: DC

## 2023-02-25 NOTE — Discharge Instructions (Addendum)
Take ibuprofen, available over the counter according to label instructions as needed for pain.

## 2023-02-25 NOTE — ED Provider Notes (Signed)
[place] EMERGENCY DEPARTMENT AT [facility] Provider Note   CSN: [id number] Arrival date & time: 02/25/23  0049     History  Chief Complaint  Patient presents with   Ankle Pain     Victor Monroe is a 37 y.o. male.  The history is provided by the patient.  Victor Monroe is a 37 y.o. male who presents to the Emergency Department complaining of ankle pain.  He presents to the emergency department for evaluation of left ankle pain that started yesterday.  No reported injuries.  He has experienced similar pain in the past that was related to tendinitis.  He has pain located along the left Achilles, which is worse with plantarflexion and dorsiflexion.  He states that he cannot walk due to pain on step-off.  He has no known medical problems and takes no routine medications.      Home Medications Prior to Admission medications   Medication Sig Start Date End Date Taking? Authorizing Provider  predniSONE (STERAPRED UNI-PAK 21 TAB) 10 MG (21) TBPK tablet Take by mouth daily. Take 6 tabs by mouth daily  for 2 days, then 5 tabs for 2 days, then 4 tabs for 2 days, then 3 tabs for 2 days, 2 tabs for 2 days, then 1 tab by mouth daily for 2 days 02/25/23   Tilden Fossa, MD      Allergies    Patient has no known allergies.    Review of Systems   Review of Systems  All other systems reviewed and are negative.   Physical Exam Updated Vital Signs BP (!) 148/101 (BP Location: Right Arm)   Pulse 85   Temp 98.2 F (36.8 C)   Resp 18   SpO2 94%  Physical Exam Vitals and nursing note reviewed.  Constitutional:      Appearance: He is well-developed.  HENT:     Head: Normocephalic and atraumatic.  Cardiovascular:     Rate and Rhythm: Normal rate and regular rhythm.  Pulmonary:     Effort: Pulmonary effort is normal.     Breath sounds: Normal breath sounds.  Musculoskeletal:     Comments: Trace edema to bilateral lower extremities.  2+ left DP pulse.  There is  tenderness to palpation throughout the left Achilles tendon.  He is able to flex and extend at the ankle but does have pain on dorsiflexion.  Skin:    General: Skin is warm and dry.  Neurological:     Mental Status: He is alert and oriented to person, place, and time.  Psychiatric:        Behavior: Behavior normal.     ED Results / Procedures / Treatments   Labs (all labs ordered are listed, but only abnormal results are displayed) Labs Reviewed - No data to display  EKG None  Radiology DG Ankle Complete Left  Result Date: 02/25/2023 CLINICAL DATA:  Pain, tendinitis EXAM: LEFT ANKLE COMPLETE - 3+ VIEW COMPARISON:  03/04/2020 FINDINGS: There is no evidence of fracture, dislocation, or joint effusion. There is no evidence of arthropathy or other focal bone abnormality. Soft tissues are unremarkable. IMPRESSION: Negative. Electronically Signed   By: Charlett Nose M.D.   On: 02/25/2023 01:11    Procedures Procedures    Medications Ordered in ED Medications - No data to display  ED Course/ Medical Decision Making/ A&P  Medical Decision Making Amount and/or Complexity of Data Reviewed Radiology: ordered.  Risk Prescription drug management.   Patient here for atraumatic left ankle pain.  He does have isolated pain over the left Achilles.  He is able to flex and extend at the ankle.  Images are negative for acute abnormality.  Suspect he has recurrent tendinitis.  Will place in a walking boot for pain control and treated with steroids.  Discussed orthopedics follow-up given his recurrent tendinitis with return precautions.       Final Clinical Impression(s) / ED Diagnoses Final diagnoses:  Achilles tendinitis of left lower extremity    Rx / DC Orders ED Discharge Orders          Ordered    predniSONE (STERAPRED UNI-PAK 21 TAB) 10 MG (21) TBPK tablet  Daily        02/25/23 0430              Tilden Fossa, MD 02/25/23 [phone]

## 2023-02-25 NOTE — ED Triage Notes (Signed)
Patient reports left ankle pain onset yesterday , denies injury/ambulatory , history of tendinitis .

## 2023-02-25 NOTE — Progress Notes (Signed)
Orthopedic Tech Progress Note Patient Details:  Victor Monroe [birth year] [id number]  Ortho Devices Type of Ortho Device: CAM walker Ortho Device/Splint Location: L foot Ortho Device/Splint Interventions: Application, Adjustment, Removal   Post Interventions Patient Tolerated: Well Instructions Provided: Adjustment of device, Care of device  Zaina Jenkin  OTR/L 02/25/2023, 5:13 AM

## 2023-04-03 DIAGNOSIS — K429 Umbilical hernia without obstruction or gangrene: Secondary | ICD-10-CM | POA: Diagnosis not present

## 2023-04-03 DIAGNOSIS — Z00111 Health examination for newborn 8 to 28 days old: Secondary | ICD-10-CM | POA: Diagnosis not present

## 2023-04-03 DIAGNOSIS — K5900 Constipation, unspecified: Secondary | ICD-10-CM | POA: Diagnosis not present

## 2024-06-21 ENCOUNTER — Encounter (HOSPITAL_COMMUNITY): Payer: Self-pay

## 2024-06-21 ENCOUNTER — Ambulatory Visit (HOSPITAL_COMMUNITY)
Admission: EM | Admit: 2024-06-21 | Discharge: 2024-06-21 | Disposition: A | Discharge status: Home / Self Care | Payer: Self-pay

## 2024-06-21 DIAGNOSIS — M7662 Achilles tendinitis, left leg: Secondary | ICD-10-CM

## 2024-06-21 DIAGNOSIS — I10 Essential (primary) hypertension: Secondary | ICD-10-CM

## 2024-06-21 MED ORDER — PREDNISONE 10 MG (21) PO TBPK: ORAL_TABLET | Freq: Every day | ORAL | 0 refills | Status: DC

## 2024-06-21 NOTE — Discharge Instructions (Addendum)
 You have elevated blood pressure and Achilles tendinitis on the left Take your steroid Dosepak. Plan to establish with a primary care provider to manage your health.

## 2024-06-21 NOTE — ED Provider Notes (Signed)
  PCP: Patient, No Pcp Per Chief Complaint: Ankle Pain  Subjective:   HPI: Patient is a 38 y.o. male here for left Achilles pain.  Patient has a known history of left Achilles tendinitis with his last 4 being in May 2024.  He states around 3 days ago he woke up with left Achilles pain.  He denies any new injuries, traumas or falls.  He works with [employer] toed boots on concrete most of the day and exacerbated his tendinitis.  He states that last year when he had a steroid Dosepak, this basically resolved the symptoms completely.  He also has elevated blood pressure today and does not have a primary care provider.  He does not take any antihypertensive meds.  Past Medical History:  Diagnosis Date   Polysubstance abuse (HCC)     No current facility-administered medications on file prior to encounter.   No current outpatient medications on file prior to encounter.    Pulse 86   Temp 98 F (36.7 C) (Oral)   Resp 18   SpO2 94%        Objective:   Gen: Well developed, well nourished male in no acute distress. HEENT: Pupils equal, round, and reactive to light.  Conjunctiva non-injected.  Nares patent without discharge.  Oral mucosa is moist and pink.   Lungs: Normal respiratory effort MSK: Left ankle is free from erythema, warmth or edema, tenderness to palpation over the Achilles tendon, Thompson test negative, no tenderness over the malleoli, patient can dorsiflex and plantarflex with good strength Ext: No cyanosis, clubbing, or edema.  Assessment/Plan:   Victor Monroe is a 37 y.o. male who was seen today for the following: 1. Achilles tendinitis of left lower extremity (Primary) - Exam significant for tenderness of the left Achilles -Thompson test negative -Patient great relief with steroid Dosepak nearly a year and a half ago -Will send in another Dosepak -Instructed patient that he will likely need physical therapy and close follow-up for this will recur -Physical therapy  order sent in  2. Essential hypertension -Patient needs to establish with a PCP -Discussed warning signs of worsening hypertension with patient  Follow-up/Education:   May return sooner as needed and encouraged to call/e-mail for additional questions or  worsening symptoms in the interim.  Victor Lowing, DO Sports Medicine Fellow 06/21/2024 9:20 AM  Disclaimer: This transcription was electronically signed. It was transcribed by [place] and may contain errors in the text that were not recognized on proofreading.      Monroe Victor HERO, DO 06/21/24 [phone]

## 2024-06-21 NOTE — ED Triage Notes (Signed)
 Pt present left ankle pain with swelling. Pt state unable to put to much pressure on the ankle. Symptoms started three days ago.

## 2024-07-27 ENCOUNTER — Encounter (HOSPITAL_COMMUNITY): Payer: Self-pay | Admitting: Emergency Medicine

## 2024-07-27 ENCOUNTER — Ambulatory Visit (HOSPITAL_COMMUNITY)
Admission: EM | Admit: 2024-07-27 | Discharge: 2024-07-27 | Disposition: A | Discharge status: Home / Self Care | Payer: Self-pay | Attending: Emergency Medicine | Admitting: Emergency Medicine

## 2024-07-27 DIAGNOSIS — M7662 Achilles tendinitis, left leg: Secondary | ICD-10-CM

## 2024-07-27 MED ORDER — KETOROLAC TROMETHAMINE 30 MG/ML IJ SOLN
INTRAMUSCULAR | Status: AC
  Filled 2024-07-27: qty 1

## 2024-07-27 MED ORDER — KETOROLAC TROMETHAMINE 30 MG/ML IJ SOLN
30.0000 mg | Freq: Once | INTRAMUSCULAR | Status: AC
  Administered 2024-07-27: 30 mg via INTRAMUSCULAR

## 2024-07-27 NOTE — Discharge Instructions (Addendum)
 The Toradol  injection given today should start to work in about 30 minutes to reduce pain and inflammation. You can safely take tylenol  if needed. Wear the ace wrap for support, compression  Elevate the foot and apply ice Call the sports clinic to make a follow up as soon as possible

## 2024-07-27 NOTE — ED Provider Notes (Signed)
 [facility]    CSN: [id number] Arrival date & time: 07/27/24  1338      History   Chief Complaint Chief Complaint  Patient presents with   Ankle Pain    HPI Victor Monroe is a 38 y.o. male.  Left posterior ankle pain started while at work today  Rates 9/10 pain with weight bearing or pushing on it Denies any new injury, trauma, or fall Has not taken any medications yet   History of achillis tendonitis this side. Has been advised follow up with ortho/PT but has not gone yet -  does not have insurance  He was given a walking boot but his job wont let him wear it   Most recently seen 1 month ago and got steroids.   Past Medical History:  Diagnosis Date   Polysubstance abuse [facility])     Patient Active Problem List   Diagnosis Date Noted   Perforated viscus 01/22/2015   Perforated duodenal ulcer (HCC) 01/22/2015    Past Surgical History:  Procedure Laterality Date   ABDOMINAL SURGERY     LAPAROTOMY N/A 01/22/2015   Procedure: EXPLORATORY LAPAROTOMY,  GRAHAM PATCH CLOSURE OF PERFORATED DUODENAL ULCER;  Surgeon: Krystal Spinner, MD;  Location: [facility];  Service: General;  Laterality: N/A;     Home Medications    Prior to Admission medications   Not on File    Family History Family History  Problem Relation Age of Onset   Supraventricular tachycardia Mother    Healthy Father     Social History Social History   Tobacco Use   Smoking status: Every Day    Current packs/day: 0.50    Average packs/day: 0.5 packs/day for 20.0 years (10.0 ttl pk-yrs)    Types: Cigarettes   Smokeless tobacco: Never  Vaping Use   Vaping status: Never Used  Substance Use Topics   Alcohol use: Yes    Alcohol/week: 3.0 standard drinks of alcohol    Types: 3 Glasses of wine per week    Comment: occassionally   Drug use: Yes    Types: Marijuana    Comment: Pt reports he used to do cocaine  but quit     Allergies   Patient has no known allergies.   Review of  Systems Review of Systems  As per HPI  Physical Exam Triage Vital Signs ED Triage Vitals  Encounter Vitals Group     BP 07/27/24 1422 129/88     Girls Systolic BP Percentile --      Girls Diastolic BP Percentile --      Boys Systolic BP Percentile --      Boys Diastolic BP Percentile --      Pulse Rate 07/27/24 1422 95     Resp 07/27/24 1422 19     Temp 07/27/24 1422 98.2 F (36.8 C)     Temp Source 07/27/24 1422 Oral     SpO2 07/27/24 1422 97 %     Weight --      Height --      Head Circumference --      Peak Flow --      Pain Score 07/27/24 1421 9     Pain Loc --      Pain Education --      Exclude from Growth Chart --    No data found.  Updated Vital Signs BP 129/88 (BP Location: Left Arm)   Pulse 95   Temp 98.2 F (36.8 C) (Oral)   Resp 19  SpO2 97%   Physical Exam Vitals and nursing note reviewed.  Constitutional:      General: He is not in acute distress. HENT:     Mouth/Throat:     Pharynx: Oropharynx is clear.  Cardiovascular:     Rate and Rhythm: Normal rate and regular rhythm.     Pulses: Normal pulses.  Pulmonary:     Effort: Pulmonary effort is normal.  Musculoskeletal:       Feet:     Comments: Tenderness along achilles, left. No palpable deformity or bony tenderness of foot. Negative thompson. Distal sensation intact. Strong DP pulse. Cap refill < 2 seconds. Skin is dry but no erythema or warmth  Skin:    General: Skin is warm and dry.     Capillary Refill: Capillary refill takes less than 2 seconds.  Neurological:     Mental Status: He is alert and oriented to person, place, and time.     UC Treatments / Results  Labs (all labs ordered are listed, but only abnormal results are displayed) Labs Reviewed - No data to display  EKG  Radiology No results found.  Procedures Procedures   Medications Ordered in UC Medications  ketorolac  (TORADOL ) 30 MG/ML injection 30 mg (30 mg Intramuscular Given 07/27/24 1459)    Initial  Impression / Assessment and Plan / UC Course  I have reviewed the triage vital signs and the nursing notes.  Pertinent labs & imaging results that were available during my care of the patient were reviewed by me and considered in my medical decision making (see chart for details).  History of achilles tendonitis Started hurting at work today. No new injury or trauma Discussed with patient supportive care and needs to follow with orthopedics. Ace wrap is given for support. IM toradol  for pain. Tylenol  at home. No further steroids at this time as he just finished course 2 weeks ago. Avoid oral NSAIDs due to ulcer history.  Note for work is provided Agrees to plan, no questions at this time  Final Clinical Impressions(s) / UC Diagnoses   Final diagnoses:  Achilles tendinitis of left lower extremity     Discharge Instructions      The Toradol  injection given today should start to work in about 30 minutes to reduce pain and inflammation. You can safely take tylenol  if needed. Wear the ace wrap for support, compression  Elevate the foot and apply ice Call the sports clinic to make a follow up as soon as possible      ED Prescriptions   None    PDMP not reviewed this encounter.   Dejia Ebron, [place], PA-C 07/27/24 [phone]

## 2024-07-27 NOTE — ED Triage Notes (Signed)
 Pt reports was at work today when left ankle started hurting denies any injury or fall. Reports hx tendonitis.

## 2024-07-30 ENCOUNTER — Other Ambulatory Visit: Payer: Self-pay

## 2024-07-30 ENCOUNTER — Encounter: Payer: Self-pay | Admitting: Family Medicine

## 2024-07-30 ENCOUNTER — Ambulatory Visit: Payer: Self-pay | Admitting: Family Medicine

## 2024-07-30 VITALS — BP 151/104 | Ht 69.0 in | Wt 240.0 lb

## 2024-07-30 DIAGNOSIS — R030 Elevated blood-pressure reading, without diagnosis of hypertension: Secondary | ICD-10-CM

## 2024-07-30 DIAGNOSIS — M7662 Achilles tendinitis, left leg: Secondary | ICD-10-CM

## 2024-07-30 DIAGNOSIS — G8929 Other chronic pain: Secondary | ICD-10-CM

## 2024-07-30 DIAGNOSIS — M7660 Achilles tendinitis, unspecified leg: Secondary | ICD-10-CM

## 2024-07-30 DIAGNOSIS — Q6672 Congenital pes cavus, left foot: Secondary | ICD-10-CM

## 2024-07-30 DIAGNOSIS — M2041 Other hammer toe(s) (acquired), right foot: Secondary | ICD-10-CM

## 2024-07-30 DIAGNOSIS — M79672 Pain in left foot: Secondary | ICD-10-CM

## 2024-07-30 DIAGNOSIS — Q6671 Congenital pes cavus, right foot: Secondary | ICD-10-CM

## 2024-07-30 NOTE — Progress Notes (Signed)
 DATE OF VISIT: 07/30/2024        Victor Monroe DOB: 06-23-1986 MRN: [id number]  Discussed the use of AI scribe software for clinical note transcription with the patient, who gave verbal consent to proceed.  History of Present Illness Victor Monroe is a 38 year old male who presents with chronic left posterior ankle/Achilles pain.  Left posterior ankle and achilles pain - Chronic pain for approximately 1.5 years, onset without specific injury - Pain localized to left posterior ankle and Achilles region - Significant discomfort upon waking and with initial ambulation in the morning when waking - Pain is recurrent, with temporary relief following treatment with PO steroids - Exacerbated by prolonged standing or walking, particularly during 12-hour work shifts requiring steel-toed shoes - Unable to use a cam boot at work due to occupational requirements - Has had multiple urgent care visits over the last year and a half on 12/29/2022, 02/25/2023, 06/21/2024, 07/27/2024.  All visit notes from these were reviewed today  Prior treatments and diagnostic evaluation - Toradol  injections provided limited improvement - Steroid pills prescribed during urgent care visits temporarily alleviate pain - No use of oral NSAIDs due to history of gastric ulcer requiring emergency surgery - No engagement in physical therapy or specific exercises for the condition - Has tried wearing heel cups/heel cushions with limited improvement - Has tried using an Ace wrap recently that does help some - X-ray of left ankle on 02/25/2023 showed no acute abnormalities  Relevant medical and surgical history - History of left ankle fracture at age 9, without significant issues until current pain began - History of gastric ulcer requiring emergency surgery  General health and access to care - No regular primary care provider - No recent blood work or routine screenings - Currently without health insurance; has  received information about financial assistance options  Associated symptoms and allergies - No headaches or migraines - No allergies to adhesives or tapes    Medications:  No outpatient encounter medications on file as of 07/30/2024.   No facility-administered encounter medications on file as of 07/30/2024.    Allergies: has no known allergies.  Physical Examination: Vitals: BP (!) 151/104   Ht 5' 9 (1.753 m)   Wt 240 lb (108.9 kg)   BMI 35.44 kg/m  GENERAL:  Victor Monroe is a 38 y.o. male appearing their stated age, alert and oriented x 3, in no apparent distress.  SKIN: no rashes or lesions, skin clean, dry, intact MSK: Foot/ankle: Bilateral feet with pes cavus.  Bilateral hammertoes 2 through 5 bilaterally.  Dorsal bossing noted along second toes bilaterally.  Significant callus along medial aspect of great toes bilaterally.  Plantar aspect of the foot with hyperkeratosis along the lateral foot bilaterally.  No increased redness or warmth.  Few small plantar warts noted bilaterally, areas are nontender to palpation.  No ulcerations.  No tenderness along the plantar fascia insertion bilaterally.  Is tender to palpation along the insertion of the left Achilles on the calcaneus.  No visible nodules or swelling.  No increased redness or warmth.  Tight Achilles on the left.  No tenderness along the right Achilles, normal Achilles mobility on the right.  Walking with a slight antalgic gait favoring the left foot NEURO: sensation intact to light touch extremity bilaterally VASC: pulses 2+ and symmetric DP/PT bilaterally, no edema  Radiology: Limited MSK ultrasound left Achilles Date: 07/30/2024 Indication: Chronic Achilles pain Findings: - Left Achilles with thickening at the insertion.  Does have hypoechoic  change along the medial and lateral aspects of the tendon.  Appears to have visual changes of the calcaneus with some cystic change, as well as cortical irregularity.   Increased Doppler flow noted within the calcaneus.  No increased Doppler flow noted within the Achilles - Left Achilles measuring 7.8 mm at insertion, 3.8 mm at the mid belly proximally - Right Achilles with normal appearance.  No thickening.  Normal-appearing calcaneus and insertion of Achilles on the calcaneus. - Right Achilles measuring 3.9 mm at the insertion.  Impression: - Left insertional Achilles tendinopathy with cortical irregularity noted along the calcaneus - Normal-appearing right Achilles Images and interpretation completed by Rainell Cedar, DO    Left ankle x-ray 02/25/2023 personally reviewed and interpreted by me today showing: - No acute bony abnormalities. - Appears to have cystic like changes at the insertion of the Achilles on the calcaneus.  No other abnormalities associated  Left ankle x-ray 03/04/2020 personally reviewed and interpreted by me today showing: - Compared to x-ray from 02/25/2023.  No acute bony abnormalities.  No visible cystic changes along the calcaneus at the insertion of the Achilles  Assessment & Plan Chronic left Achilles pain with acute exacerbation without any specific injury or trauma.  MSK ultrasound showing insertional Achilles tendinopathy.  Also cortical irregularity throughout the calcaneus concerning for possible bony abnormality such as stress fracture or other abnormality High arches and claw toes may contribute.  - MRI needed to assess bone integrity given abnormality seen on ultrasound. - Recommend Voltaren gel on affected area 3-4 times daily. Cannot take oral NSAIDs due to hx of perforated ulcer - Advise wearing cushioned shoes and heel cushions.  While at work and at home - Provide information on financial assistance for MRI at [facility] and [place]. - Schedule follow-up 3-5 days post-MRI to review results.  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 Avoidance of oral NSAIDs due to ulcer history. - Use Voltaren gel as a topical NSAID alternative.  Hypertension Elevated blood pressure noted today. Anxiety and pain may contribute. Importance of managing blood pressure discussed. - Recheck of blood pressure during visit still elevated. - Encourage follow-up with primary care physician for blood pressure management.     Patient expressed understanding & agreement with above.  Encounter Diagnoses  Name Primary?   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 We discussed your history of peptic ulcer disease and noted elevated blood pressure during the visit. You were provided with a plan to address these issues and given information on financial assistance for further diagnostic imaging.  YOUR PLAN: -CHRONIC ACHILLES TENDINOPATHY WITH POSSIBLE CALCANEAL STRESS FRACTURE: This condition involves chronic pain and partial tearing of the Achilles tendon, with a possible stress fracture in the heel bone. We will order an MRI of your left ankle to check for a stress fracture. In the meantime, you should apply Voltaren gel to the affected area 3-4 times daily, wear cushioned shoes, and use heel  cushions. Information on financial assistance for the MRI has been provided.  -PEPTIC ULCER DISEASE: You have a history of peptic ulcer disease, which means you have had sores in the lining of your stomach. To avoid aggravating this condition, you should use Voltaren gel as a topical alternative to oral NSAIDs.  -HYPERTENSION: Your blood pressure was elevated during the visit, which can be influenced by anxiety and pain. It is important to manage your blood pressure, so please follow up with a primary care physician for further management.  INSTRUCTIONS: Please schedule an MRI of your left ankle at [facility] or [facility], and follow up with us  3-5 days after the MRI to review the results. Additionally, make an appointment with a primary care physician to manage your blood pressure. Contains text generated by Abridge.

## 2024-08-16 ENCOUNTER — Inpatient Hospital Stay
Admission: RE | Admit: 2024-08-16 | Discharge: 2024-08-16 | Payer: Self-pay | Attending: Family Medicine | Admitting: Family Medicine

## 2024-08-16 DIAGNOSIS — M7662 Achilles tendinitis, left leg: Secondary | ICD-10-CM

## 2024-08-20 ENCOUNTER — Ambulatory Visit: Payer: Self-pay | Admitting: Family Medicine

## 2024-08-20 NOTE — Progress Notes (Signed)
 MRI results and images reviewed.  MyChart message sent.

## 2024-08-30 ENCOUNTER — Encounter: Payer: Self-pay | Admitting: Family Medicine

## 2024-08-30 ENCOUNTER — Ambulatory Visit (INDEPENDENT_AMBULATORY_CARE_PROVIDER_SITE_OTHER): Payer: Self-pay | Admitting: Family Medicine

## 2024-08-30 VITALS — BP 153/107 | Ht 69.0 in | Wt 235.0 lb

## 2024-08-30 DIAGNOSIS — M7662 Achilles tendinitis, left leg: Secondary | ICD-10-CM

## 2024-08-30 DIAGNOSIS — R030 Elevated blood-pressure reading, without diagnosis of hypertension: Secondary | ICD-10-CM

## 2024-08-30 MED ORDER — NITROGLYCERIN 0.2 MG/HR TD PT24: MEDICATED_PATCH | TRANSDERMAL | 1 refills | Status: AC

## 2024-08-30 NOTE — Patient Instructions (Signed)

## 2024-08-30 NOTE — Progress Notes (Signed)
 Reviewed at visit 08/30/24 - see Progress Note for further details

## 2024-08-30 NOTE — Progress Notes (Signed)
 DATE OF VISIT: 08/30/2024        Victor Monroe DOB: October 03, 1986 MRN: [id number]  Discussed the use of AI scribe software for clinical note transcription with the patient, who gave verbal consent to proceed.  History of Present Illness Victor Monroe is a 38 year old male who presents with persistent left ankle pain and heel pain Last seen by me 07/30/2024, MRI completed after last visit and reviewed as noted below  Left posterior ankle pain - Persistent pain localized to the back of the ankle - Minimal improvement since the last visit - MRI demonstrates osteochondral changes likely related to previous injuries  Medication and allergy history - No regular medication use - No known drug allergies - No allergies to tapes or adhesives  Headache and migraine history - No history of headaches or migraines    Medications:  Outpatient Encounter Medications as of 08/30/2024  Medication Sig   nitroGLYCERIN  (NITRODUR - DOSED IN MG/24 HR) 0.2 mg/hr patch Use 1/4 patch daily to the affected area.   No facility-administered encounter medications on file as of 08/30/2024.    Allergies: has no known allergies.  Physical Examination: Vitals: BP (!) 153/107   Ht 5' 9 (1.753 m)   Wt 235 lb (106.6 kg)   BMI 34.70 kg/m  GENERAL:  Victor Monroe is a 38 y.o. male appearing their stated age, alert and oriented x 3, in no apparent distress.  SKIN: no rashes or lesions, skin clean, dry, intact MSK: Walking without a limp Neurovascular intact distally  Radiology: MRI left heel without contrast showing: IMPRESSION: - Insertional tendinosis of the Achilles tendon without evidence of significant tear. There is mild reactive edema in the posterior body of the calcaneus. - Osteochondral changes in the medial and lateral talar dome without significant subchondral collapse or loose fragment. Mild developing subchondral cyst are identified in the medial talar dome. No significant joint  effusion. - Mild tenosynovitis of peroneal tendons.  Assessment & Plan Left Achilles tendinopathy Chronic tendinopathy with swelling and inflammation at tendon-bone attachment. Condition exacerbated by altered gait and stress. 3 - MRI findings reviewed in detail.  Patient is candidate for topical nitroglycerin  to help with his symptoms.  No prior history of migraine headaches or chronic headaches, no adhesive allergy. -Rx topical nitroglycerin  to use as directed over the next 12 weeks. Discussed topical nitroglycerin  for healing. Side effects include headaches, manageable with analgesics.  - Provided home exercises for Achilles tendinopathy. - Scheduled follow-up in 6 weeks to assess progress.  Elevated blood pressure without diagnosis of hypertension Blood pressure elevated, consistent with previous readings. No hypertension diagnosis.  - Topical nitroglycerin  for Achilles may slightly lower blood pressure. - Emphasized regular follow-up with primary care for management. - Recommended follow-up with primary care provider for blood pressure management.   Patient expressed understanding & agreement with above.  Encounter Diagnoses  Name Primary?   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    Contains text generated by Abridge.

## 2024-10-15 ENCOUNTER — Ambulatory Visit: Admitting: Family Medicine
